# Patient Record
Sex: Female | Born: 1940 | Race: Black or African American | Hispanic: No | Smoking: Never smoker
Health system: Southern US, Community
[De-identification: ages and names within clinical notes are randomized; demographics above are authoritative.]

## PROBLEM LIST (undated history)

## (undated) DIAGNOSIS — K8020 Calculus of gallbladder without cholecystitis without obstruction: Secondary | ICD-10-CM

## (undated) DIAGNOSIS — F0390 Unspecified dementia without behavioral disturbance: Secondary | ICD-10-CM

## (undated) DIAGNOSIS — I10 Essential (primary) hypertension: Secondary | ICD-10-CM

## (undated) DIAGNOSIS — M2560 Stiffness of unspecified joint, not elsewhere classified: Secondary | ICD-10-CM

## (undated) HISTORY — PX: ABDOMINAL HYSTERECTOMY: SHX81

## (undated) HISTORY — PX: HERNIA REPAIR: SHX51

## (undated) HISTORY — DX: Essential (primary) hypertension: I10

---

## 2013-09-13 ENCOUNTER — Ambulatory Visit (INDEPENDENT_AMBULATORY_CARE_PROVIDER_SITE_OTHER): Payer: Medicare Other | Admitting: General Surgery

## 2013-09-13 ENCOUNTER — Encounter (INDEPENDENT_AMBULATORY_CARE_PROVIDER_SITE_OTHER): Payer: Self-pay | Admitting: General Surgery

## 2013-09-13 VITALS — BP 124/84 | HR 74 | Temp 97.8°F | Resp 16 | Ht 66.0 in | Wt 209.8 lb

## 2013-09-13 DIAGNOSIS — K8020 Calculus of gallbladder without cholecystitis without obstruction: Secondary | ICD-10-CM

## 2013-09-13 NOTE — Progress Notes (Signed)
Patient ID: Gabrielle PinaHelen Canny, female   DOB: 1941-05-30, 73 y.o.   MRN: [id number]  Chief Complaint  Patient presents with  . New Evaluation    gallbladder    HPI Gabrielle Brown is a 73 y.o. female.  She is referred by Dr. Sue LushAndrea, a gastro[facility]ologist [facility] for evaluation and surgical management of symptomatic gallstones. Dr. Leane Callarl Winfield is [facility] PCP and [facility].  The patient has a long history of GERD. On 07/06/2012 she had an ultrasound which shows gallstones. She was apparently had some type of GI symptoms at that time. For the past 3 months she's been having accelerating symptoms of substernal chest discomfort, postprandial vomiting, early satiety. She states that she would sometimes she will eat something and then 2 or 3 hours later she'll have problems with belching and vomiting and discomfort that radiates to her back. She denies dysphagia or painful swallowing.  Recent GI valuation led to an upper endoscopy which showed a hiatal hernia, some small polyps, since some gastritis but really no mechanical problems or obstructive problems and no neoplastic problems. After the endoscopy the gastrologist referred her for surgical intervention. She is here today with her daughter.  Comorbidities include GERD, obesity, history of abdominal hysterectomy, history of tubal ligation, and hypertension. She denies any history of stroke, cardiac disease, diabetes, COPD, tobacco or alcohol use.  She is in no distress today.  HPI  Past Medical History  Diagnosis Date  . Hypertension     Past Surgical History  Procedure Laterality Date  . Abdominal hysterectomy      No family history on file.  Social History History  Substance Use Topics  . Smoking status: Never Smoker   . Smokeless tobacco: Not on file  . Alcohol Use: No    No Known Allergies  Current Outpatient Prescriptions  Medication Sig Dispense Refill  . atenolol (TENORMIN) 25 MG tablet       . BENICAR  HCT 20-12.5 MG per tablet       . cefdinir (OMNICEF) 300 MG capsule       . omeprazole (PRILOSEC) 20 MG capsule       . PREMARIN 0.625 MG tablet       . zolpidem (AMBIEN) 10 MG tablet        No current facility-administered medications for this visit.    Review of Systems Review of Systems  Constitutional: Negative for fever, chills and unexpected weight change.  HENT: Negative for congestion, hearing loss, sore throat, trouble swallowing and voice change.   Eyes: Negative for visual disturbance.  Respiratory: Negative for cough and wheezing.   Cardiovascular: Positive for chest pain. Negative for palpitations and leg swelling.  Gastrointestinal: Positive for nausea, vomiting and abdominal pain. Negative for diarrhea, constipation, blood in stool, abdominal distention and anal bleeding.  Genitourinary: Negative for hematuria, vaginal bleeding and difficulty urinating.  Musculoskeletal: Positive for back pain. Negative for arthralgias.  Skin: Negative for rash and wound.  Neurological: Negative for seizures, syncope and headaches.  Hematological: Negative for adenopathy. Does not bruise/bleed easily.  Psychiatric/Behavioral: Negative for confusion.    Blood pressure 124/84, pulse 74, temperature 97.8 F (36.6 C), resp. rate 16, height 5\' 6"  (1.676 m), weight 209 lb 12.8 oz (95.165 kg).  Physical Exam Physical Exam  Constitutional: She is oriented to person, place, and time. She appears well-developed and well-nourished. No distress.  HENT:  Head: Normocephalic and atraumatic.  Nose: Nose normal.  Mouth/Throat: No oropharyngeal exudate.  Eyes: Conjunctivae and  EOM are normal. Pupils are equal, round, and reactive to light. Left eye exhibits no discharge. No scleral icterus.  Neck: Neck supple. No JVD present. No tracheal deviation present. No thyromegaly present.  Cardiovascular: Normal rate, regular rhythm, normal heart sounds and intact distal pulses.   No murmur  heard. Pulmonary/Chest: Effort normal and breath sounds normal. No respiratory distress. She has no wheezes. She has no rales. She exhibits no tenderness.  Abdominal: Soft. Bowel sounds are normal. She exhibits no distension and no mass. There is no tenderness. There is no rebound and no guarding.  Lower midline scar, presumably from tubal ligation. Pfannenstiel incision, presumably from hysterectomy.  Musculoskeletal: She exhibits no edema and no tenderness.  Lymphadenopathy:    She has no cervical adenopathy.  Neurological: She is alert and oriented to person, place, and time. She exhibits normal muscle tone. Coordination normal.  Skin: Skin is warm. No rash noted. She is not diaphoretic. No erythema. No pallor.  Psychiatric: She has a normal mood and affect. Her behavior is normal. Judgment and thought content normal.    Data Reviewed Upper endoscopy report. Ultrasound report. GI consultation report.  Assessment    Chronic cholecystitis with cholelithiasis. After lengthy interviewed and questions I believe that her symptoms are very consistent with biliary colic. Upper endoscopy seems to rule out any mechanical or structural problem with the esophagus or stomach.  History of GERD  History tubal ligation  History of bowel hysterectomy  Hypertension     Plan    I spent a very, very, very long time discussing this with the patient her daughter. The daughter has very good insight. The patient is a little fearful, especially if she would  have to be converted to an open laparotomy. In the end she is willing to undergo cholecystectomy and seems to understand the indications, details and techniques of the surgery.  She'll be scheduled for laparoscopic cholecystectomy with cholangiogram, possible open.  I've discussed the indications, details, techniques, and numerous risk of the surgery with the patient and her daughter. They're both aware of the risk of bleeding, infection, conversion  to open laparotomy, bile leak and readmission to  the hospital, injury to adjacent organs such as the main bile duct or intestine with major reconstructive surgery, cardiac, pulmonary, and thromboembolic problems. I've gone over all of these issues in detail with patient information booklet and  diagrams. They understand all these issues. All their questions were answered. At the end of the discussion they agree with this plan.        Angelia Mould. Derrell Lolling, M.D., [facility] General and Minimally invasive Surgery Breast and Colorectal Surgery Office:   [phone] Pager:   [phone]  09/13/2013, 12:46 PM

## 2013-09-13 NOTE — Patient Instructions (Signed)
You have gallstones, and therefore you have a diseased gallbladder.  Your episodes of discomfort and nausea and vomiting and back pain are most likely due to your gallbladder.  you will be scheduled for an elective laparoscopic cholecystectomy with cholangiogram, possible open in the near future      Laparoscopic Cholecystectomy Laparoscopic cholecystectomy is surgery to remove the gallbladder. The gallbladder is located slightly to the right of center in the abdomen, behind the liver. It is a concentrating and storage sac for the bile produced in the liver. Bile aids in the digestion and absorption of fats. Gallbladder disease (cholecystitis) is an inflammation of your gallbladder. This condition is usually caused by a buildup of gallstones (cholelithiasis) in your gallbladder. Gallstones can block the flow of bile, resulting in inflammation and pain. In severe cases, emergency surgery may be required. When emergency surgery is not required, you will have time to prepare for the procedure. Laparoscopic surgery is an alternative to open surgery. Laparoscopic surgery usually has a shorter recovery time. Your common bile duct may also need to be examined and explored. Your caregiver will discuss this with you if he or she feels this should be done. If stones are found in the common bile duct, they may be removed. LET YOUR CAREGIVER KNOW ABOUT:  Allergies to food or medicine.  Medicines taken, including vitamins, herbs, eyedrops, over-the-counter medicines, and creams.  Use of steroids (by mouth or creams).  Previous problems with anesthetics or numbing medicines.  History of bleeding problems or blood clots.  Previous surgery.  Other health problems, including diabetes and kidney problems.  Possibility of pregnancy, if this applies. RISKS AND COMPLICATIONS All surgery is associated with risks. Some problems that may occur following this procedure include:  Infection.  Damage to the  common bile duct, nerves, arteries, veins, or other internal organs such as the stomach or intestines.  Bleeding.  A stone may remain in the common bile duct. BEFORE THE PROCEDURE  Do not take aspirin for 3 days prior to surgery or blood thinners for 1 week prior to surgery.  Do not eat or drink anything after midnight the night before surgery.  Let your caregiver know if you develop a cold or other infectious problem prior to surgery.  You should be present 60 minutes before the procedure or as directed. PROCEDURE  You will be given medicine that makes you sleep (general anesthetic). When you are asleep, your surgeon will make several small cuts (incisions) in your abdomen. One of these incisions is used to insert a small, lighted scope (laparoscope) into the abdomen. The laparoscope helps the surgeon see into your abdomen. Carbon dioxide gas will be pumped into your abdomen. The gas allows more room for the surgeon to perform your surgery. Other operating instruments are inserted through the other incisions. Laparoscopic procedures may not be appropriate when:  There is major scarring from previous surgery.  The gallbladder is extremely inflamed.  There are bleeding disorders or unexpected cirrhosis of the liver.  A pregnancy is near term.  Other conditions make the laparoscopic procedure impossible. If your surgeon feels it is not safe to continue with a laparoscopic procedure, he or she will perform an open abdominal procedure. In this case, the surgeon will make an incision to open the abdomen. This gives the surgeon a larger view and field to work within. This may allow the surgeon to perform procedures that sometimes cannot be performed with a laparoscope alone. Open surgery has a longer recovery  time. AFTER THE PROCEDURE  You will be taken to the recovery area where a nurse will watch and check your progress.  You may be allowed to go home the same day.  Do not resume  physical activities until directed by your caregiver.  You may resume a normal diet and activities as directed. Document Released: 08/25/2005 Document Revised: 11/17/2011 Document Reviewed: 04/06/2013 [facility] Patient Information 2014 [place], [place].

## 2013-09-14 ENCOUNTER — Encounter (INDEPENDENT_AMBULATORY_CARE_PROVIDER_SITE_OTHER): Payer: Self-pay

## 2013-09-16 ENCOUNTER — Encounter (HOSPITAL_COMMUNITY): Payer: Self-pay | Admitting: Pharmacy Technician

## 2013-09-20 ENCOUNTER — Ambulatory Visit (HOSPITAL_COMMUNITY)
Admission: RE | Admit: 2013-09-20 | Discharge: 2013-09-20 | Disposition: A | Discharge status: Home / Self Care | Payer: Medicare Other | Source: Ambulatory Visit | Attending: General Surgery | Admitting: General Surgery

## 2013-09-20 ENCOUNTER — Encounter (HOSPITAL_COMMUNITY): Payer: Self-pay

## 2013-09-20 ENCOUNTER — Encounter (HOSPITAL_COMMUNITY)
Admission: RE | Admit: 2013-09-20 | Discharge: 2013-09-20 | Disposition: A | Discharge status: Home / Self Care | Payer: Medicare Other | Source: Ambulatory Visit | Attending: General Surgery | Admitting: General Surgery

## 2013-09-20 DIAGNOSIS — Z01812 Encounter for preprocedural laboratory examination: Secondary | ICD-10-CM | POA: Insufficient documentation

## 2013-09-20 DIAGNOSIS — K8020 Calculus of gallbladder without cholecystitis without obstruction: Secondary | ICD-10-CM | POA: Insufficient documentation

## 2013-09-20 DIAGNOSIS — Z01818 Encounter for other preprocedural examination: Secondary | ICD-10-CM | POA: Insufficient documentation

## 2013-09-20 DIAGNOSIS — I771 Stricture of artery: Secondary | ICD-10-CM | POA: Insufficient documentation

## 2013-09-20 DIAGNOSIS — Z01810 Encounter for preprocedural cardiovascular examination: Secondary | ICD-10-CM | POA: Insufficient documentation

## 2013-09-20 DIAGNOSIS — I10 Essential (primary) hypertension: Secondary | ICD-10-CM | POA: Insufficient documentation

## 2013-09-20 HISTORY — DX: Calculus of gallbladder without cholecystitis without obstruction: K80.20

## 2013-09-20 HISTORY — DX: Stiffness of unspecified joint, not elsewhere classified: M25.60

## 2013-09-20 LAB — COMPREHENSIVE METABOLIC PANEL
ALBUMIN: 3.4 g/dL — AB (ref 3.5–5.2)
ALT: 16 U/L (ref 0–35)
AST: 18 U/L (ref 0–37)
Alkaline Phosphatase: 80 U/L (ref 39–117)
BUN: 14 mg/dL (ref 6–23)
CALCIUM: 9.2 mg/dL (ref 8.4–10.5)
CO2: 28 mEq/L (ref 19–32)
Chloride: 95 mEq/L — ABNORMAL LOW (ref 96–112)
Creatinine, Ser: 0.84 mg/dL (ref 0.50–1.10)
GFR calc non Af Amer: 67 mL/min — ABNORMAL LOW (ref >90)
GFR, EST AFRICAN AMERICAN: 78 mL/min — AB (ref >90)
GLUCOSE: 95 mg/dL (ref 70–99)
Potassium: 4.3 mEq/L (ref 3.7–5.3)
Sodium: 134 mEq/L — ABNORMAL LOW (ref 137–147)
Total Bilirubin: 0.3 mg/dL (ref 0.3–1.2)
Total Protein: 7 g/dL (ref 6.0–8.3)

## 2013-09-20 LAB — CBC WITH DIFFERENTIAL/PLATELET
BASOS PCT: 0 % (ref 0–1)
Basophils Absolute: 0 10*3/uL (ref 0.0–0.1)
EOS ABS: 0.1 10*3/uL (ref 0.0–0.7)
Eosinophils Relative: 1 % (ref 0–5)
HCT: 38.7 % (ref 36.0–46.0)
Hemoglobin: 12.8 g/dL (ref 12.0–15.0)
LYMPHS PCT: 24 % (ref 12–46)
Lymphs Abs: 2.1 10*3/uL (ref 0.7–4.0)
MCH: 23.3 pg — AB (ref 26.0–34.0)
MCHC: 33.1 g/dL (ref 30.0–36.0)
MCV: 70.4 fL — ABNORMAL LOW (ref 78.0–100.0)
MONO ABS: 0.9 10*3/uL (ref 0.1–1.0)
Monocytes Relative: 10 % (ref 3–12)
Neutro Abs: 5.7 10*3/uL (ref 1.7–7.7)
Neutrophils Relative %: 65 % (ref 43–77)
PLATELETS: 162 10*3/uL (ref 150–400)
RBC: 5.5 MIL/uL — ABNORMAL HIGH (ref 3.87–5.11)
RDW: 13.7 % (ref 11.5–15.5)
WBC: 8.8 10*3/uL (ref 4.0–10.5)

## 2013-09-20 NOTE — Patient Instructions (Signed)
YOUR SURGERY IS SCHEDULED AT [facility]  ON:  Tuesday  1/20  REPORT TO  SHORT STAY CENTER AT:   7:10 AM      PHONE # FOR SHORT STAY IS [phone]  DO NOT EAT OR DRINK ANYTHING AFTER MIDNIGHT THE NIGHT BEFORE YOUR SURGERY.  YOU MAY BRUSH YOUR TEETH, RINSE OUT YOUR MOUTH--BUT NO WATER, NO FOOD, NO CHEWING GUM, NO MINTS, NO CANDIES, NO CHEWING TOBACCO.  PLEASE TAKE THE FOLLOWING MEDICATIONS THE AM OF YOUR SURGERY WITH A FEW SIPS OF WATER:  PREMARIN, ATENOLOL, OMEPRAZOLE   DO NOT BRING VALUABLES, MONEY, CREDIT CARDS.  DO NOT WEAR JEWELRY, MAKE-UP, NAIL POLISH AND NO METAL PINS OR CLIPS IN YOUR HAIR. CONTACT LENS, DENTURES / PARTIALS, GLASSES SHOULD NOT BE WORN TO SURGERY AND IN MOST CASES-HEARING AIDS WILL NEED TO BE REMOVED.  BRING YOUR GLASSES CASE, ANY EQUIPMENT NEEDED FOR YOUR CONTACT LENS. FOR PATIENTS ADMITTED TO THE HOSPITAL--CHECK OUT TIME THE DAY OF DISCHARGE IS 11:00 AM.  ALL INPATIENT ROOMS ARE PRIVATE - WITH BATHROOM, TELEPHONE, TELEVISION AND WIFI INTERNET.                                                    PLEASE READ OVER ANY  FACT SHEETS THAT YOU WERE GIVEN: MRSA INFORMATION, BLOOD TRANSFUSION INFORMATION, INCENTIVE SPIROMETER INFORMATION.  FAILURE TO FOLLOW THESE INSTRUCTIONS MAY RESULT IN THE CANCELLATION OF YOUR SURGERY. PLEASE BE AWARE THAT YOU MAY NEED ADDITIONAL BLOOD DRAWN DAY OF YOUR SURGERY  PATIENT SIGNATURE_________________________________

## 2013-09-20 NOTE — Pre-Procedure Instructions (Addendum)
E[facility] AND [facility] WERE DONE TODAY [facility]. PT DOES NOT READ WELL - HER DISCHARGE INSTRUCTIONS WERE REVIEWED WITH HER AND HER DAUGHTER TERESA PLUMMER - TERESA VOICED UNDERSTANDING AND SIGNED INSTRUCTIONS.  PT LIVES WITH HER DAUGHTER AND HER DAUGHTER WILL HELP HER MOTHER WITH FOLLOWINGTHE PREOP INSTRUCTIONS.

## 2013-09-26 NOTE — H&P (Signed)
Gabrielle Brown   MRN:  [id number]   Description: 73 year old female  Provider: Ernestene Mention, MD  Department: [facility]          Diagnoses      Gallstones    -  Primary      574.20               Current Vitals - Last Recorded      BP Pulse Temp(Src) Resp Ht Wt      124/84 74 97.8 F (36.6 C) 16 5\' 6"  (1.676 m) 209 lb 12.8 oz (95.165 kg)     BMI  33.88 kg/m2                         History and Physical      Ernestene Mention, MD      Status: Signed            Patient ID: Gabrielle Brown, female   DOB: 1941-04-08, 73 y.o.   MRN: [id number]             HPI [name] is a 73 y.o. female.  She is referred by Dr. Sue Lush, a gastroenterologist at [facility] for evaluation and surgical management of symptomatic gallstones. Dr. Leane Call is her PCP and [facility].   The patient has a long history of GERD. On 07/06/2012 she had an ultrasound which shows gallstones. She was apparently had some type of GI symptoms at that time. For the past 3 months she's been having accelerating symptoms of substernal chest discomfort, postprandial vomiting, early satiety. She states that she would sometimes she will eat something and then 2 or 3 hours later she'll have problems with belching and vomiting and discomfort that radiates to her back. She denies dysphagia or painful swallowing.   Recent GI valuation led to an upper endoscopy which showed a hiatal hernia, some small polyps, since some gastritis but really no mechanical problems or obstructive problems and no neoplastic problems. After the endoscopy the gastrologist referred her for surgical intervention. She is here today with her daughter.   Comorbidities include GERD, obesity, history of abdominal hysterectomy, history of tubal ligation, and hypertension. She denies any history of stroke, cardiac disease, diabetes, COPD, tobacco or alcohol use.   She is in no distress today.         Past Medical History   Diagnosis  Date   .  Hypertension           Past Surgical History   Procedure  Laterality  Date   .  Abdominal hysterectomy            No family history on file.   Social History History   Substance Use Topics   .  Smoking status:  Never Smoker    .  Smokeless tobacco:  Not on file   .  Alcohol Use:  No        No Known Allergies    Current Outpatient Prescriptions   Medication  Sig  Dispense  Refill   .  atenolol (TENORMIN) 25 MG tablet           .  BENICAR HCT 20-12.5 MG per tablet           .  cefdinir (OMNICEF) 300 MG capsule           .  omeprazole (PRILOSEC) 20 MG capsule           .  PREMARIN 0.625 MG tablet           .  zolpidem (AMBIEN) 10 MG tablet               No current facility-administered medications for this visit.        Review of Systems  Constitutional: Negative for fever, chills and unexpected weight change.  HENT: Negative for congestion, hearing loss, sore throat, trouble swallowing and voice change.   Eyes: Negative for visual disturbance.  Respiratory: Negative for cough and wheezing.   Cardiovascular: Positive for chest pain. Negative for palpitations and leg swelling.  Gastrointestinal: Positive for nausea, vomiting and abdominal pain. Negative for diarrhea, constipation, blood in stool, abdominal distention and anal bleeding.  Genitourinary: Negative for hematuria, vaginal bleeding and difficulty urinating.  Musculoskeletal: Positive for back pain. Negative for arthralgias.  Skin: Negative for rash and wound.  Neurological: Negative for seizures, syncope and headaches.  Hematological: Negative for adenopathy. Does not bruise/bleed easily.  Psychiatric/Behavioral: Negative for confusion.      Blood pressure 124/84, pulse 74, temperature 97.8 F (36.6 C), resp. rate 16, height 5\' 6"  (1.676 m), weight 209 lb 12.8 oz (95.165 kg).   Physical Exam  Constitutional: She is oriented to person, place, and  time. She appears well-developed and well-nourished. No distress.  HENT:   Head: Normocephalic and atraumatic.   Nose: Nose normal.   Mouth/Throat: No oropharyngeal exudate.  Eyes: Conjunctivae and EOM are normal. Pupils are equal, round, and reactive to light. Left eye exhibits no discharge. No scleral icterus.  Neck: Neck supple. No JVD present. No tracheal deviation present. No thyromegaly present.  Cardiovascular: Normal rate, regular rhythm, normal heart sounds and intact distal pulses.    No murmur heard. Pulmonary/Chest: Effort normal and breath sounds normal. No respiratory distress. She has no wheezes. She has no rales. She exhibits no tenderness.  Abdominal: Soft. Bowel sounds are normal. She exhibits no distension and no mass. There is no tenderness. There is no rebound and no guarding.  Lower midline scar, presumably from tubal ligation. Pfannenstiel incision, presumably from hysterectomy.  Musculoskeletal: She exhibits no edema and no tenderness.  Lymphadenopathy:    She has no cervical adenopathy.  Neurological: She is alert and oriented to person, place, and time. She exhibits normal muscle tone. Coordination normal.  Skin: Skin is warm. No rash noted. She is not diaphoretic. No erythema. No pallor.  Psychiatric: She has a normal mood and affect. Her behavior is normal. Judgment and thought content normal.      Data Reviewed Upper endoscopy report. Ultrasound report. GI consultation report.   Assessment    Chronic cholecystitis with cholelithiasis. After lengthy interviewed and questions I believe that her symptoms are very consistent with biliary colic. Upper endoscopy seems to rule out any mechanical or structural problem with the esophagus or stomach.   History of GERD   History tubal ligation   History of bowel hysterectomy   Hypertension      Plan    I spent a very, very, very long time discussing this with the patient her daughter. The daughter has  very good insight. The patient is a little fearful, especially if she would  have to be converted to an open laparotomy. In the end she is willing to undergo cholecystectomy and seems to understand the indications, details and techniques of the surgery.   She'll be scheduled for laparoscopic cholecystectomy with cholangiogram, possible open.   I've discussed the indications, details, techniques, and numerous  risk of the surgery with the patient and her daughter. They're both aware of the risk of bleeding, infection, conversion to open laparotomy, bile leak and readmission to  the hospital, injury to adjacent organs such as the main bile duct or intestine with major reconstructive surgery, cardiac, pulmonary, and thromboembolic problems. I've gone over all of these issues in detail with patient information booklet and  diagrams. They understand all these issues. All their questions were answered. At the end of the discussion they agree with this plan.         Angelia Mould. Derrell Lolling, M.D., [facility] General and Minimally invasive Surgery Breast and Colorectal Surgery Office:   [phone] Pager:   [phone]

## 2013-09-27 ENCOUNTER — Ambulatory Visit (HOSPITAL_COMMUNITY): Payer: Medicare Other

## 2013-09-27 ENCOUNTER — Ambulatory Visit (HOSPITAL_COMMUNITY)
Admission: RE | Admit: 2013-09-27 | Discharge: 2013-09-28 | Disposition: A | Discharge status: Home / Self Care | Payer: Medicare Other | Source: Ambulatory Visit | Attending: General Surgery | Admitting: General Surgery

## 2013-09-27 ENCOUNTER — Encounter (HOSPITAL_COMMUNITY): Payer: Medicare Other | Admitting: Anesthesiology

## 2013-09-27 ENCOUNTER — Encounter (HOSPITAL_COMMUNITY): Payer: Self-pay | Admitting: *Deleted

## 2013-09-27 ENCOUNTER — Ambulatory Visit (HOSPITAL_COMMUNITY): Payer: Medicare Other | Admitting: Anesthesiology

## 2013-09-27 ENCOUNTER — Encounter (HOSPITAL_COMMUNITY)
Admission: RE | Disposition: A | Discharge status: Home / Self Care | Payer: Self-pay | Source: Ambulatory Visit | Attending: General Surgery

## 2013-09-27 DIAGNOSIS — K824 Cholesterolosis of gallbladder: Secondary | ICD-10-CM

## 2013-09-27 DIAGNOSIS — K420 Umbilical hernia with obstruction, without gangrene: Secondary | ICD-10-CM | POA: Insufficient documentation

## 2013-09-27 DIAGNOSIS — Z79899 Other long term (current) drug therapy: Secondary | ICD-10-CM | POA: Insufficient documentation

## 2013-09-27 DIAGNOSIS — I10 Essential (primary) hypertension: Secondary | ICD-10-CM | POA: Insufficient documentation

## 2013-09-27 DIAGNOSIS — K8020 Calculus of gallbladder without cholecystitis without obstruction: Secondary | ICD-10-CM

## 2013-09-27 DIAGNOSIS — K8010 Calculus of gallbladder with chronic cholecystitis without obstruction: Secondary | ICD-10-CM

## 2013-09-27 DIAGNOSIS — I440 Atrioventricular block, first degree: Secondary | ICD-10-CM | POA: Insufficient documentation

## 2013-09-27 DIAGNOSIS — E669 Obesity, unspecified: Secondary | ICD-10-CM | POA: Insufficient documentation

## 2013-09-27 DIAGNOSIS — Z90710 Acquired absence of both cervix and uterus: Secondary | ICD-10-CM | POA: Insufficient documentation

## 2013-09-27 DIAGNOSIS — K219 Gastro-esophageal reflux disease without esophagitis: Secondary | ICD-10-CM | POA: Insufficient documentation

## 2013-09-27 HISTORY — PX: CHOLECYSTECTOMY: SHX55

## 2013-09-27 LAB — BASIC METABOLIC PANEL
BUN: 10 mg/dL (ref 6–23)
CO2: 28 mEq/L (ref 19–32)
Calcium: 9 mg/dL (ref 8.4–10.5)
Chloride: 97 mEq/L (ref 96–112)
Creatinine, Ser: 0.8 mg/dL (ref 0.50–1.10)
GFR calc Af Amer: 83 mL/min — ABNORMAL LOW (ref >90)
GFR, EST NON AFRICAN AMERICAN: 71 mL/min — AB (ref >90)
GLUCOSE: 108 mg/dL — AB (ref 70–99)
Potassium: 3.7 mEq/L (ref 3.7–5.3)
Sodium: 136 mEq/L — ABNORMAL LOW (ref 137–147)

## 2013-09-27 LAB — MAGNESIUM: MAGNESIUM: 1.8 mg/dL (ref 1.5–2.5)

## 2013-09-27 SURGERY — LAPAROSCOPIC CHOLECYSTECTOMY
Anesthesia: General | Site: Abdomen

## 2013-09-27 MED ORDER — PROMETHAZINE HCL 25 MG/ML IJ SOLN: 6.2500 mg | INTRAMUSCULAR | Status: DC | PRN

## 2013-09-27 MED ORDER — 0.9 % SODIUM CHLORIDE (POUR BTL) OPTIME
TOPICAL | Status: DC | PRN
  Administered 2013-09-27: 1000 mL

## 2013-09-27 MED ORDER — HYDROMORPHONE HCL PF 1 MG/ML IJ SOLN
INTRAMUSCULAR | Status: AC
  Administered 2013-09-27: 14:00:00
  Filled 2013-09-27: qty 1

## 2013-09-27 MED ORDER — ENOXAPARIN SODIUM 40 MG/0.4ML ~~LOC~~ SOLN
40.0000 mg | SUBCUTANEOUS | Status: DC
  Administered 2013-09-28: 40 mg via SUBCUTANEOUS
  Filled 2013-09-27 (×2): qty 0.4

## 2013-09-27 MED ORDER — PROPOFOL 10 MG/ML IV BOLUS
INTRAVENOUS | Status: DC | PRN
  Administered 2013-09-27: 40 mg via INTRAVENOUS
  Administered 2013-09-27: 80 mg via INTRAVENOUS

## 2013-09-27 MED ORDER — FENTANYL CITRATE 0.05 MG/ML IJ SOLN
INTRAMUSCULAR | Status: DC | PRN
  Administered 2013-09-27: 25 ug via INTRAVENOUS
  Administered 2013-09-27: 50 ug via INTRAVENOUS
  Administered 2013-09-27: 25 ug via INTRAVENOUS

## 2013-09-27 MED ORDER — ONDANSETRON HCL 4 MG/2ML IJ SOLN
INTRAMUSCULAR | Status: DC | PRN
  Administered 2013-09-27 (×2): 2 mg via INTRAVENOUS

## 2013-09-27 MED ORDER — NEOSTIGMINE METHYLSULFATE 1 MG/ML IJ SOLN
INTRAMUSCULAR | Status: AC
  Filled 2013-09-27: qty 10

## 2013-09-27 MED ORDER — EPHEDRINE SULFATE 50 MG/ML IJ SOLN
INTRAMUSCULAR | Status: DC | PRN
  Administered 2013-09-27: 7.5 mg via INTRAVENOUS
  Administered 2013-09-27: 5 mg via INTRAVENOUS

## 2013-09-27 MED ORDER — MIDAZOLAM HCL 5 MG/5ML IJ SOLN
INTRAMUSCULAR | Status: DC | PRN
  Administered 2013-09-27: 0.5 mg via INTRAVENOUS

## 2013-09-27 MED ORDER — ZOLPIDEM TARTRATE 5 MG PO TABS
5.0000 mg | ORAL_TABLET | Freq: Every evening | ORAL | Status: DC | PRN
  Administered 2013-09-28: 5 mg via ORAL
  Filled 2013-09-27: qty 1

## 2013-09-27 MED ORDER — CISATRACURIUM BESYLATE (PF) 10 MG/5ML IV SOLN
INTRAVENOUS | Status: DC | PRN
  Administered 2013-09-27: 5 mg via INTRAVENOUS

## 2013-09-27 MED ORDER — PANTOPRAZOLE SODIUM 40 MG PO TBEC
40.0000 mg | DELAYED_RELEASE_TABLET | Freq: Every day | ORAL | Status: DC
  Administered 2013-09-28: 40 mg via ORAL
  Filled 2013-09-27: qty 1

## 2013-09-27 MED ORDER — ZOLPIDEM TARTRATE 10 MG PO TABS: 10.0000 mg | ORAL_TABLET | Freq: Every evening | ORAL | Status: DC | PRN

## 2013-09-27 MED ORDER — FENTANYL CITRATE 0.05 MG/ML IJ SOLN: 25.0000 ug | INTRAMUSCULAR | Status: DC | PRN

## 2013-09-27 MED ORDER — ATENOLOL 25 MG PO TABS
25.0000 mg | ORAL_TABLET | Freq: Two times a day (BID) | ORAL | Status: DC
Start: 2013-09-27 — End: 2013-09-28
  Administered 2013-09-27 – 2013-09-28 (×2): 25 mg via ORAL
  Filled 2013-09-27 (×4): qty 1

## 2013-09-27 MED ORDER — BUPIVACAINE-EPINEPHRINE PF 0.5-1:200000 % IJ SOLN
INTRAMUSCULAR | Status: AC
  Filled 2013-09-27: qty 30

## 2013-09-27 MED ORDER — CHLORHEXIDINE GLUCONATE 4 % EX LIQD: 1.0000 "application " | Freq: Once | CUTANEOUS | Status: DC

## 2013-09-27 MED ORDER — MIDAZOLAM HCL 2 MG/2ML IJ SOLN
INTRAMUSCULAR | Status: AC
  Filled 2013-09-27: qty 2

## 2013-09-27 MED ORDER — CEFAZOLIN SODIUM-DEXTROSE 2-3 GM-% IV SOLR
2.0000 g | INTRAVENOUS | Status: AC
  Administered 2013-09-27: 2 g via INTRAVENOUS

## 2013-09-27 MED ORDER — NEOSTIGMINE METHYLSULFATE 1 MG/ML IJ SOLN
INTRAMUSCULAR | Status: DC | PRN
  Administered 2013-09-27: 3 mg via INTRAVENOUS

## 2013-09-27 MED ORDER — HYDROMORPHONE HCL PF 1 MG/ML IJ SOLN
0.2500 mg | INTRAMUSCULAR | Status: DC | PRN
  Administered 2013-09-27: 0.5 mg via INTRAVENOUS

## 2013-09-27 MED ORDER — SODIUM CHLORIDE 0.9 % IJ SOLN
INTRAMUSCULAR | Status: AC
  Filled 2013-09-27: qty 10

## 2013-09-27 MED ORDER — IOHEXOL 300 MG/ML  SOLN
INTRAMUSCULAR | Status: DC | PRN
  Administered 2013-09-27: 17 mL

## 2013-09-27 MED ORDER — ONDANSETRON HCL 4 MG/2ML IJ SOLN
4.0000 mg | Freq: Four times a day (QID) | INTRAMUSCULAR | Status: DC | PRN
  Filled 2013-09-27: qty 2

## 2013-09-27 MED ORDER — ESTROGENS CONJUGATED 0.625 MG PO TABS
0.6250 mg | ORAL_TABLET | Freq: Every morning | ORAL | Status: DC
  Administered 2013-09-28: 0.625 mg via ORAL
  Filled 2013-09-27: qty 1

## 2013-09-27 MED ORDER — CEFAZOLIN SODIUM-DEXTROSE 2-3 GM-% IV SOLR
INTRAVENOUS | Status: AC
  Filled 2013-09-27: qty 50

## 2013-09-27 MED ORDER — KETAMINE HCL 10 MG/ML IJ SOLN
INTRAMUSCULAR | Status: AC
  Filled 2013-09-27: qty 1

## 2013-09-27 MED ORDER — ONDANSETRON HCL 4 MG PO TABS
4.0000 mg | ORAL_TABLET | Freq: Four times a day (QID) | ORAL | Status: DC | PRN
  Administered 2013-09-27: 4 mg via ORAL

## 2013-09-27 MED ORDER — FENTANYL CITRATE 0.05 MG/ML IJ SOLN
INTRAMUSCULAR | Status: AC
  Filled 2013-09-27: qty 5

## 2013-09-27 MED ORDER — LIDOCAINE HCL (CARDIAC) 20 MG/ML IV SOLN
INTRAVENOUS | Status: DC | PRN
  Administered 2013-09-27: 30 mg via INTRAVENOUS

## 2013-09-27 MED ORDER — OXYCODONE HCL 5 MG PO TABS: 5.0000 mg | ORAL_TABLET | Freq: Once | ORAL | Status: DC | PRN

## 2013-09-27 MED ORDER — LACTATED RINGERS IR SOLN
Status: DC | PRN
  Administered 2013-09-27: 1000 mL

## 2013-09-27 MED ORDER — MEPERIDINE HCL 50 MG/ML IJ SOLN: 6.2500 mg | INTRAMUSCULAR | Status: DC | PRN

## 2013-09-27 MED ORDER — ONDANSETRON HCL 4 MG/2ML IJ SOLN
INTRAMUSCULAR | Status: AC
  Filled 2013-09-27: qty 2

## 2013-09-27 MED ORDER — POTASSIUM CHLORIDE IN NACL 20-0.9 MEQ/L-% IV SOLN
INTRAVENOUS | Status: DC
  Administered 2013-09-27: 18:00:00 via INTRAVENOUS
  Filled 2013-09-27 (×4): qty 1000

## 2013-09-27 MED ORDER — SUCCINYLCHOLINE CHLORIDE 20 MG/ML IJ SOLN
INTRAMUSCULAR | Status: DC | PRN
  Administered 2013-09-27: 100 mg via INTRAVENOUS

## 2013-09-27 MED ORDER — HYDROCHLOROTHIAZIDE 12.5 MG PO CAPS
12.5000 mg | ORAL_CAPSULE | Freq: Every day | ORAL | Status: DC
  Administered 2013-09-27 – 2013-09-28 (×2): 12.5 mg via ORAL
  Filled 2013-09-27 (×2): qty 1

## 2013-09-27 MED ORDER — BUPIVACAINE-EPINEPHRINE 0.5% -1:200000 IJ SOLN
INTRAMUSCULAR | Status: DC | PRN
  Administered 2013-09-27: 18 mL

## 2013-09-27 MED ORDER — OXYCODONE HCL 5 MG/5ML PO SOLN
5.0000 mg | Freq: Once | ORAL | Status: DC | PRN
  Filled 2013-09-27: qty 5

## 2013-09-27 MED ORDER — PROPOFOL 10 MG/ML IV BOLUS
INTRAVENOUS | Status: AC
  Filled 2013-09-27: qty 20

## 2013-09-27 MED ORDER — OLMESARTAN MEDOXOMIL-HCTZ 20-12.5 MG PO TABS: 1.0000 | ORAL_TABLET | Freq: Every morning | ORAL | Status: DC

## 2013-09-27 MED ORDER — LIDOCAINE HCL (CARDIAC) 20 MG/ML IV SOLN
INTRAVENOUS | Status: AC
  Filled 2013-09-27: qty 5

## 2013-09-27 MED ORDER — OXYCODONE-ACETAMINOPHEN 5-325 MG PO TABS
1.0000 | ORAL_TABLET | ORAL | Status: DC | PRN
  Administered 2013-09-27 (×2): 1 via ORAL
  Filled 2013-09-27 (×2): qty 1

## 2013-09-27 MED ORDER — KETAMINE HCL 10 MG/ML IJ SOLN
INTRAMUSCULAR | Status: DC | PRN
  Administered 2013-09-27: 10 mg via INTRAVENOUS

## 2013-09-27 MED ORDER — GLYCOPYRROLATE 0.2 MG/ML IJ SOLN
INTRAMUSCULAR | Status: DC | PRN
  Administered 2013-09-27: 0.4 mg via INTRAVENOUS
  Administered 2013-09-27: 0.2 mg via INTRAVENOUS

## 2013-09-27 MED ORDER — IRBESARTAN 150 MG PO TABS
150.0000 mg | ORAL_TABLET | Freq: Every day | ORAL | Status: DC
  Administered 2013-09-27 – 2013-09-28 (×2): 150 mg via ORAL
  Filled 2013-09-27 (×2): qty 1

## 2013-09-27 MED ORDER — LACTATED RINGERS IV SOLN
INTRAVENOUS | Status: DC | PRN
  Administered 2013-09-27: 09:00:00 via INTRAVENOUS

## 2013-09-27 SURGICAL SUPPLY — 35 items
APPLIER CLIP ROT 10 11.4 M/L (STAPLE) ×3
BENZOIN TINCTURE PRP APPL 2/3 (GAUZE/BANDAGES/DRESSINGS) IMPLANT
CANISTER SUCTION 2500CC (MISCELLANEOUS) ×3 IMPLANT
CLIP APPLIE ROT 10 11.4 M/L (STAPLE) ×1 IMPLANT
CLOSURE WOUND 1/2 X4 (GAUZE/BANDAGES/DRESSINGS)
COVER MAYO STAND STRL (DRAPES) ×3 IMPLANT
DECANTER SPIKE VIAL GLASS SM (MISCELLANEOUS) IMPLANT
DERMABOND ADVANCED (GAUZE/BANDAGES/DRESSINGS) ×2
DERMABOND ADVANCED .7 DNX12 (GAUZE/BANDAGES/DRESSINGS) ×1 IMPLANT
DRAPE C-ARM 42X120 X-RAY (DRAPES) ×3 IMPLANT
DRAPE LAPAROSCOPIC ABDOMINAL (DRAPES) ×3 IMPLANT
ELECT REM PT RETURN 9FT ADLT (ELECTROSURGICAL) ×3
ELECTRODE REM PT RTRN 9FT ADLT (ELECTROSURGICAL) ×1 IMPLANT
GLOVE BIOGEL PI IND STRL 7.0 (GLOVE) ×1 IMPLANT
GLOVE BIOGEL PI INDICATOR 7.0 (GLOVE) ×2
GLOVE EUDERMIC 7 POWDERFREE (GLOVE) ×3 IMPLANT
GOWN STRL REUS W/TWL LRG LVL3 (GOWN DISPOSABLE) ×3 IMPLANT
GOWN STRL REUS W/TWL XL LVL3 (GOWN DISPOSABLE) ×6 IMPLANT
HEMOSTAT SURGICEL 4X8 (HEMOSTASIS) IMPLANT
KIT BASIN OR (CUSTOM PROCEDURE TRAY) ×3 IMPLANT
NS IRRIG 1000ML POUR BTL (IV SOLUTION) ×3 IMPLANT
PENCIL BUTTON HOLSTER BLD 10FT (ELECTRODE) ×3 IMPLANT
POUCH SPECIMEN RETRIEVAL 10MM (ENDOMECHANICALS) IMPLANT
SET CHOLANGIOGRAPH MIX (MISCELLANEOUS) ×3 IMPLANT
SET IRRIG TUBING LAPAROSCOPIC (IRRIGATION / IRRIGATOR) ×3 IMPLANT
SOLUTION ANTI FOG 6CC (MISCELLANEOUS) ×3 IMPLANT
STRIP CLOSURE SKIN 1/2X4 (GAUZE/BANDAGES/DRESSINGS) IMPLANT
SUT MNCRL AB 4-0 PS2 18 (SUTURE) ×3 IMPLANT
SUT NOVA NAB DX-16 0-1 5-0 T12 (SUTURE) ×3 IMPLANT
TOWEL OR 17X26 10 PK STRL BLUE (TOWEL DISPOSABLE) ×9 IMPLANT
TRAY LAP CHOLE (CUSTOM PROCEDURE TRAY) ×3 IMPLANT
TROCAR BLADELESS OPT 5 75 (ENDOMECHANICALS) IMPLANT
TROCAR XCEL BLUNT TIP 100MML (ENDOMECHANICALS) ×3 IMPLANT
TROCAR XCEL NON-BLD 11X100MML (ENDOMECHANICALS) IMPLANT
TUBING INSUFFLATION 10FT LAP (TUBING) ×3 IMPLANT

## 2013-09-27 NOTE — Anesthesia Preprocedure Evaluation (Signed)
Anesthesia Evaluation  Patient identified by MRN, date of birth, ID band Patient awake    Reviewed: Allergy & Precautions, H&P , NPO status , Patient's Chart, lab work & pertinent test results  Airway Mallampati: II TM Distance: >3 FB Neck ROM: Full    Dental  (+) Dental Advisory Given   Pulmonary neg pulmonary ROS,          Cardiovascular hypertension, Pt. on medications Rhythm:Regular Rate:Normal     Neuro/Psych negative neurological ROS  negative psych ROS   GI/Hepatic negative GI ROS, Neg liver ROS,   Endo/Other  negative endocrine ROS  Renal/GU negative Renal ROS     Musculoskeletal negative musculoskeletal ROS (+)   Abdominal   Peds  Hematology negative hematology ROS (+)   Anesthesia Other Findings   Reproductive/Obstetrics negative OB ROS                           Anesthesia Physical Anesthesia Plan  ASA: II  Anesthesia Plan: General   Post-op Pain Management:    Induction: Intravenous  Airway Management Planned: Oral ETT  Additional Equipment:   Intra-op Plan:   Post-operative Plan: Extubation in OR  Informed Consent: I have reviewed the patients History and Physical, chart, labs and discussed the procedure including the risks, benefits and alternatives for the proposed anesthesia with the patient or authorized representative who has indicated his/her understanding and acceptance.   Dental advisory given  Plan Discussed with: CRNA  Anesthesia Plan Comments:         Anesthesia Quick Evaluation

## 2013-09-27 NOTE — Transfer of Care (Signed)
Immediate Anesthesia Transfer of Care Note  Patient: Gabrielle PinaHelen Brown  Procedure(s) Performed: Procedure(s): LAPAROSCOPIC CHOLECYSTECTOMY, CHOLANGIOGRAM, REPAIR OF UMBILICAL HERNIA (N/A)  Patient Location: PACU  Anesthesia Type:General  Level of Consciousness: awake, alert , oriented and patient cooperative  Airway & Oxygen Therapy: Patient Spontanous Breathing and Patient connected to face mask oxygen  Post-op Assessment: Report given to PACU RN and Post -op Vital signs reviewed and stable  Post vital signs: stable  Complications: No apparent anesthesia complications

## 2013-09-27 NOTE — Anesthesia Postprocedure Evaluation (Signed)
Anesthesia Post Note  Patient: Gabrielle PinaHelen Brown  Procedure(s) Performed: Procedure(s) (LRB): LAPAROSCOPIC CHOLECYSTECTOMY, CHOLANGIOGRAM, REPAIR OF UMBILICAL HERNIA (N/A)  Anesthesia type: General  Patient location: PACU  Post pain: Pain level controlled  Post assessment: Post-op Vital signs reviewed  Last Vitals: BP 157/83  Pulse 76  Temp(Src) 36.4 C (Oral)  Resp 20  SpO2 98%  Post vital signs: Reviewed  Level of consciousness: sedated  Complications: No apparent anesthesia complications

## 2013-09-27 NOTE — Op Note (Addendum)
Patient Name:           Gabrielle Brown   Date of Surgery:        09/27/2013  Pre op Diagnosis:      Chronic cholecystitis with cholelithiasis  Post op Diagnosis:    Chronic cholecystitis with cholelithiasis, incarcerated umbilical hernia  Procedure:                 Laparoscopic cholecystectomy with cholangiogram, repair of incarceratedumbilical hernia  Surgeon:                     Angelia Mould. Derrell Lolling, M.D., FACS  Assistant:                      Glenna Fellows, M.D., FACS  Operative Indications:   Gabrielle Brown is a 73 y.o. female. She is referred by Dr. Sue Lush, a gastroenterologist at [facility] for evaluation and surgical management of symptomatic gallstones. Dr. Leane Call is her PCP in [place].  The patient has a long history of GERD. On 07/06/2012 she had an ultrasound which shows gallstones. She was apparently had some type of GI symptoms at that time. For the past 3 months she's been having accelerating symptoms of substernal chest discomfort, postprandial vomiting, early satiety. She states that she would sometimes she will eat something and then 2 or 3 hours later she'll have problems with belching and vomiting and discomfort that radiates to her back. She denies dysphagia or painful swallowing.  Recent GI valuation led to an upper endoscopy which showed a hiatal hernia, some small polyps, since some gastritis but really no mechanical problems or obstructive problems and no neoplastic problems. After the endoscopy the gastrologist referred her for surgical intervention. She is here today with her daughter.  Comorbidities include GERD, obesity, history of abdominal hysterectomy, history of tubal ligation, and hypertension. She denies any history of stroke, cardiac disease, diabetes, COPD, tobacco or alcohol use.   Operative Findings:       The gallbladder was chronically inflamed, clearly discolored, gallstones. Slightly thick walled. The cholangiogram was  basically normal, showing normal intrahepatic and extrahepatic biliary anatomy, no dilatation, no filling defect, and no obstruction with good flow of contrast into the duodenum. The liver, stomach, duodenum, small intestine, and large intestine were grossly normal. She had an umbilical hernia in the upper center of the umbilicus with some incarcerated omentum. This needed to be repaired to avoid progressive herniation in the future.  Procedure in Detail:          Following the induction of general endotracheal anesthesia the patient's abdomen was prepped and draped in a sterile fashion, surgical time out was performed, and  intravenous antibiotics were given. 0.5% Marcaine with epinephrine was used as local infiltration anesthetic.    A vertical incision was made in the lower rim of the umbilicus. The fascia was identified below the hernia and incised slightly until we entered the hernia space. We pushed the omentum away from the undersurface of the umbilicus and placed an 11 mm Hassan trocar and secured this with pursestring   suture of 0 Vicryl. Pneumoperitoneum was created. We were able to insert the video camera and manipulate this around the omental adhesions. There were no other adhesions within the abdomen that we could detect. An 11 mm trocar was placed in the subxiphoid region and two 5 mm trocars placed in the right upper quadrant. The gallbladder fundus was elevated. The infundibulum was retracted laterally.  Some adhesions were taken down. We dissected out the cystic duct and the cystic artery until we had a large window behind both the structures and a good critical view. Cystic artery was secured with metal clips and divided. A cholangiogram catheter was inserted into the cystic duct and a cholangiogram was obtained with the C-arm. This cholangiogram was normal as described above. The cholangiogram catheter was removed, the cystic duct was secured with multiple metal clips and divided. We found a  small posterior branch of the cystic artery and isolated it and divided between metal clips close to the gallbladder. The gallbladder was dissected from its bed with electrocautery placed in a specimen bag and  removed. The operative field was copiously irrigated. A couple of bleeding points on the gallbladder bed controlled with cautery. At the end of the case irrigation fluid was completely clear there was no bleeding or bile leak.      With the camera in the epigastric port I then took down the omental adhesions out of the incarcerated  Umbilical hernia. There was no bleeding. We then removed the trocars and released the pneumoperitoneum. I placed retractors in the umbilicus and enlarged the incision a little bit. Until the fascia was completely defined. I then closed the umbilical hernia defect primarily with about 7 interrupted sutures of #1 Novofil placed transversely. These were all placed and then tied and the repair felt good. The wounds were irrigated with saline. The skin incision were closed with subcuticular sutures of 4-0 Monocryl and Dermabond. The patient tolerated the procedure well was taken to recovery in stable condition. EBL 15 cc. Counts correct. Operations none.     Angelia MouldHaywood M. Derrell LollingIngram, M.D., FACS General and Minimally Invasive Surgery Breast and Colorectal Surgery  09/27/2013 10:53 AM

## 2013-09-27 NOTE — Interval H&P Note (Signed)
History and Physical Interval Note:  09/27/2013 8:38 AM  Gabrielle PinaHelen Doble  has presented today for surgery, with the diagnosis of gallstones  The goals and the various methods of treatment have been discussed with the patient and family. After consideration of risks, benefits and other options for treatment, the patient has consented to  Procedure(s): LAPAROSCOPIC CHOLECYSTECTOMY POSSIBLE IOC (N/A) as a surgical intervention .  The patient's history has been reviewed, patient examined, no change in status, stable for surgery.  I have reviewed the patient's chart and labs.  Questions were answered to the patient's satisfaction.     Ernestene MentionINGRAM,Damarion Mendizabal M

## 2013-09-28 ENCOUNTER — Encounter (HOSPITAL_COMMUNITY): Payer: Self-pay | Admitting: General Surgery

## 2013-09-28 MED ORDER — HYDROCODONE-ACETAMINOPHEN 5-325 MG PO TABS: 1.0000 | ORAL_TABLET | Freq: Four times a day (QID) | ORAL | Status: DC | PRN

## 2013-09-28 NOTE — Progress Notes (Signed)
DC instructions carefully reviewed with patient and daughter.  No changes noted since AM assessment.  IV DC'ed.  Lap sites clean, dry, and intact with dermabond.  Home med rec carefully reviewed-including which meds have been taken and which ones she will need to take tonight.  Patient has a f/u appt on 2/2 with Dr. Derrell LollingIngram.  Rx for pain given to daughter.  Patient and daughter verbalize understanding of all instructions and deny further questions.

## 2013-09-28 NOTE — Discharge Instructions (Signed)
-  see above 

## 2013-09-28 NOTE — Discharge Summary (Cosign Needed)
Patient ID: Gabrielle Brown [id number] 73 y.o. 20-Oct-1940  Admit date: 09/27/2013  Discharge date and time: No discharge date for patient encounter.  Admitting Physician: Ernestene Mention  Discharge Physician: Ernestene Mention  Admission Diagnoses: gallstones  Discharge Diagnoses: Gallstones Incarcerated umbilical hernia First degree AV block GERD History tubal ligation History of abdominal hysterectomy Hypertension  Operations: Procedure(s): LAPAROSCOPIC CHOLECYSTECTOMY, CHOLANGIOGRAM, REPAIR OF UMBILICAL HERNIA  Admission Condition: good  Discharged Condition: good  Indication for Admission: Gabrielle Brown is a 73 y.o. female. She is referred by Dr. Sue Lush, a gastroenterologist at [facility] for evaluation and surgical management of symptomatic gallstones. Dr. Leane Call is her PCP in [place].  The patient has a long history of GERD. On 07/06/2012 she had an ultrasound which shows gallstones. She was apparently had some type of GI symptoms at that time. For the past 3 months she's been having accelerating symptoms of substernal chest discomfort, postprandial vomiting, early satiety. She states that she would sometimes she will eat something and then 2 or 3 hours later she'll have problems with belching and vomiting and discomfort that radiates to her back. She denies dysphagia or painful swallowing.  Recent GI evaluation led to an upper endoscopy which showed a hiatal hernia, some small polyps, since some gastritis but really no mechanical problems or obstructive problems and no neoplastic problems. After the endoscopy the gastrologist referred her for surgical intervention. She is here today with her daughter.  Comorbidities include GERD, obesity, history of abdominal hysterectomy, history of tubal ligation, and hypertension. She denies any history of stroke, cardiac disease, diabetes, COPD, tobacco or alcohol use.   Hospital Course: On the day of  admission the patient was taken to the operating room and underwent a laparoscopic cholecystectomy with cholangiogram and repair of incarcerated umbilical hernia.The cholangiogram was unremarkable. There was a hernia at the umbilicus with incarcerated omentum which had to be dissected and debrided out.The hernia was repaired primarily.    In the PACU the anesthesiologist noticed that she had first-degree AV block but EKG was otherwise unremarkable. The anesthesiologist felt this was most likely due to a vagal reaction due to anesthesia and did not advise any further evaluation. The patient had no chest pain, shortness of breath or palpitations. Bmet and magnesium level were normal. The patient had one episode of vomiting after surgery but on postop day 1 she felt good and looked good and was hungry. She had been ambulating and was voiding uneventfully and she wanted to go home. Her abdomen was soft and benign and the wounds looked clean. She was given instructions in diet, activities, and followup. She was given a prescription for Norco 30 tablets for pain.  Consults: None  Significant Diagnostic Studies: radiology: intraoperative cholangiogram  Treatments: surgery: Laparoscopic cholecystectomy with cholangiogram, repair incarcerated umbilical hernia  Disposition: Home  Patient Instructions:    Medication List         atenolol 25 MG tablet  Commonly known as:  TENORMIN  Take 25 mg by mouth 2 (two) times daily.     BENICAR HCT 20-12.5 MG per tablet  Generic drug:  olmesartan-hydrochlorothiazide  Take 1 tablet by mouth every morning.     HYDROcodone-acetaminophen 5-325 MG per tablet  Commonly known as:  NORCO  Take 1-2 tablets by mouth every 6 (six) hours as needed.     omeprazole 20 MG capsule  Commonly known as:  PRILOSEC  Take 20 mg by mouth daily.     PREMARIN 0.625 MG tablet  Generic drug:  estrogens (conjugated)  Take 0.625 mg by mouth every morning.     zolpidem 10 MG tablet   Commonly known as:  AMBIEN  Take 10 mg by mouth at bedtime as needed for sleep.        Activity: no heavy lifting for 4 weeks. No driving for 2-3 days. Diet: low fat, low cholesterol diet Wound Care: none needed  Follow-up:  With Dr. Derrell LollingIngram in 3 weeks.  Signed: Angelia MouldHaywood M. Derrell LollingIngram, M.D., FACS General and minimally invasive surgery Breast and Colorectal Surgery  09/28/2013, 6:26 AM

## 2013-10-10 ENCOUNTER — Ambulatory Visit (INDEPENDENT_AMBULATORY_CARE_PROVIDER_SITE_OTHER): Payer: Medicare Other | Admitting: General Surgery

## 2013-10-10 ENCOUNTER — Encounter (INDEPENDENT_AMBULATORY_CARE_PROVIDER_SITE_OTHER): Payer: Self-pay | Admitting: General Surgery

## 2013-10-10 VITALS — BP 126/78 | HR 76 | Temp 98.1°F | Resp 14 | Ht 66.0 in | Wt 209.6 lb

## 2013-10-10 DIAGNOSIS — K8020 Calculus of gallbladder without cholecystitis without obstruction: Secondary | ICD-10-CM

## 2013-10-10 NOTE — Patient Instructions (Signed)
You are recovering from your gallbladder surgery and the surgery to repair your umbilical hernia without any obvious complications.  You may drive your car.  You may bathe anyway you wish.  Take a walk around the block every day.  Return to see Dr. Derrell LollingIngram as needed.

## 2013-10-10 NOTE — Progress Notes (Signed)
Patient ID: Gabrielle PinaHelen Brown, female   DOB: 1941/01/26, 73 y.o.   MRN: [id number] History: This patient underwent laparoscopic cholecystectomy with cholangiogram and primary repair of incarcerated umbilical hernia for 09/27/2013. Final pathology shows chronic cholecystitis with cholelithiasis. She is doing well. Appetite is essentially normal. Bowel function normal. No wound problems  Exam: Patient looks well. Daughter is with her. Abdomen is soft and nontender. All the trocar sites are healing normally.  Assessment: Chronic cholecystitis with cholelithiasis  incarcerated umbilical hernia Recovering uneventfully following above described surgery Obesity Hypertension GERD  Plan: Diet and activities discussed. Return to see me as needed.    Angelia MouldHaywood M. Derrell LollingIngram, M.D., [facility] General and Minimally invasive Surgery Breast and Colorectal Surgery Office:   [phone] Pager:   [phone]

## 2014-07-31 IMAGING — CR DG CHEST 2V
2 series · 2 of 2 positions shown · non-contrast
Comparison: None.

CLINICAL DATA: Preoperative study, history of hypertension

EXAM:
CHEST  2 VIEW

[w chest pa]
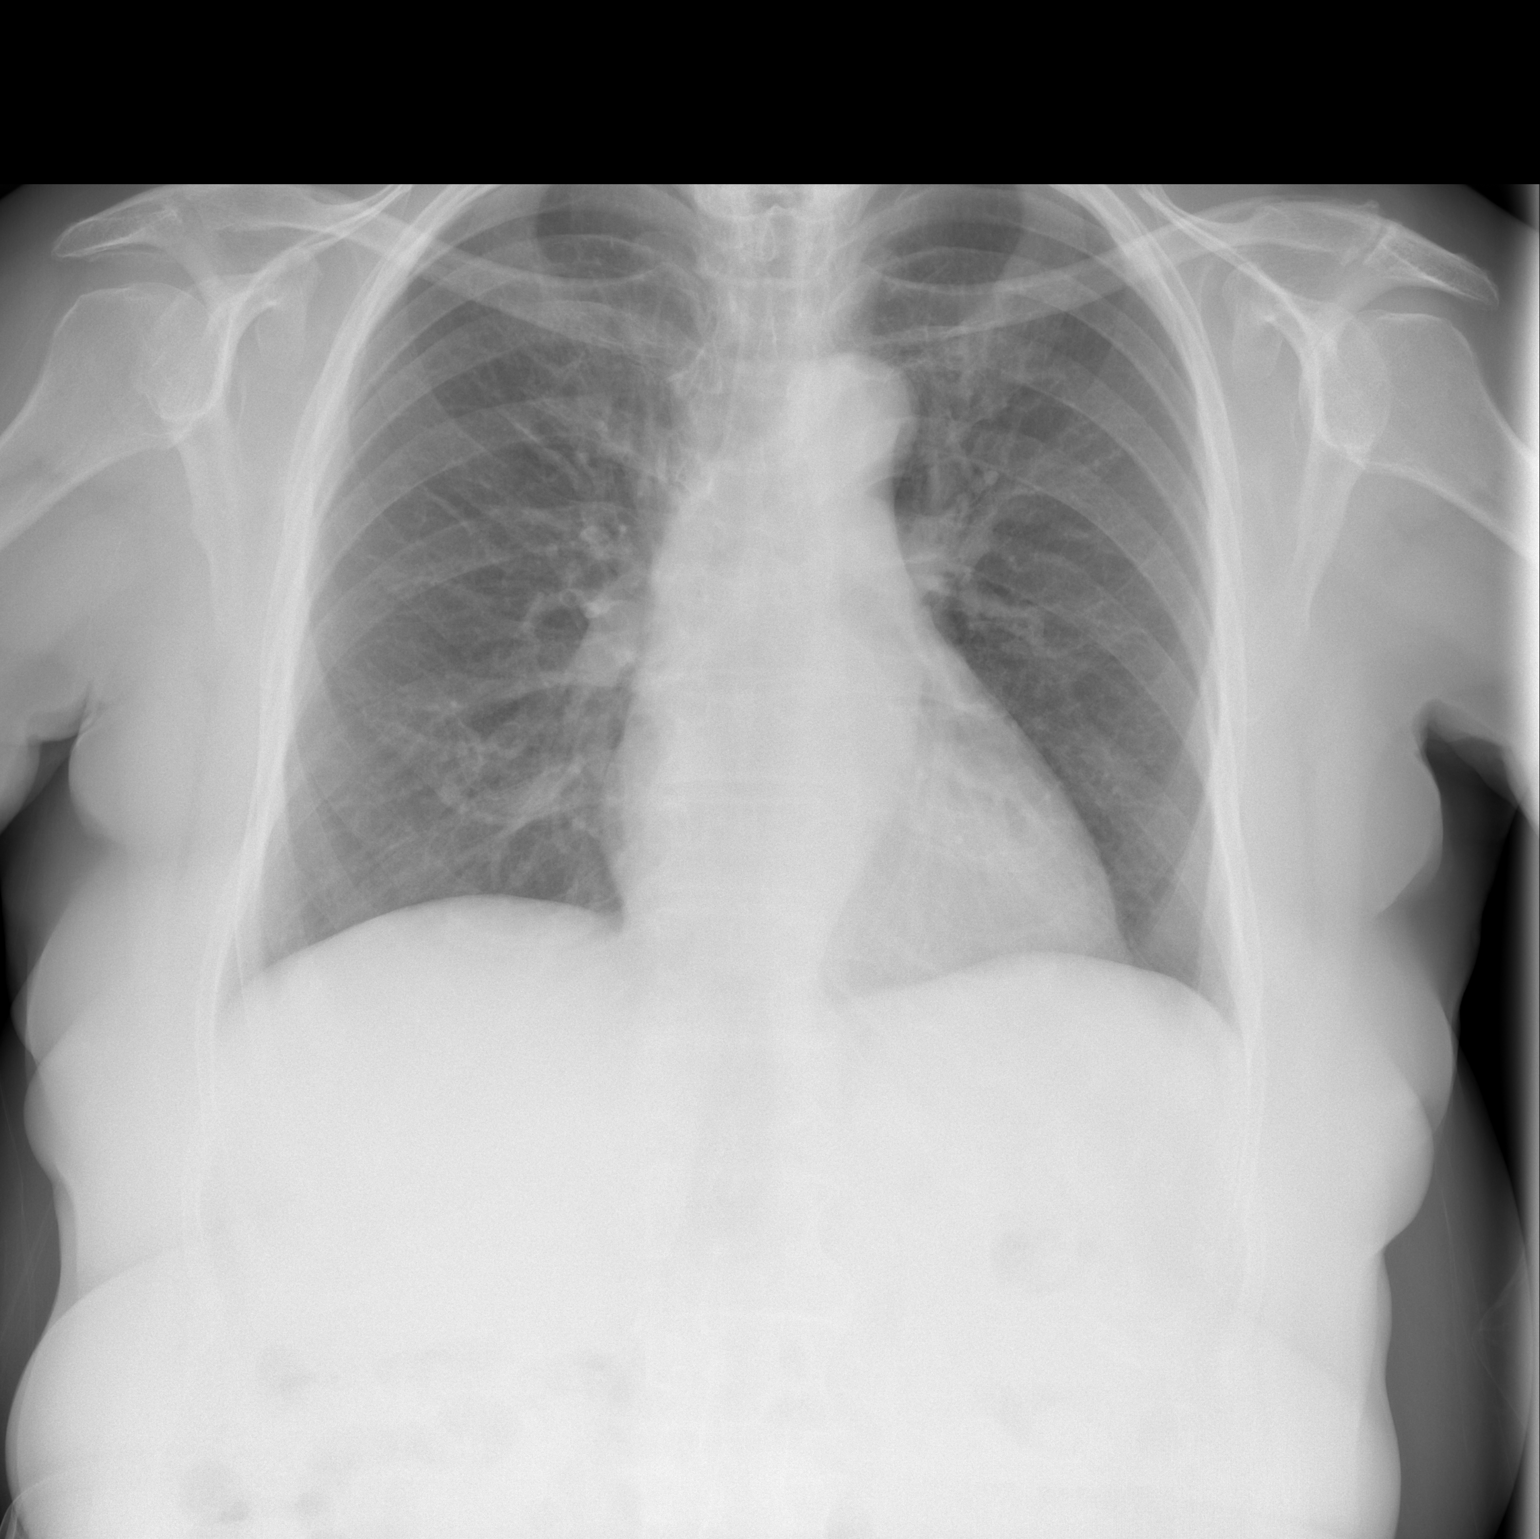

[w chest lat *]
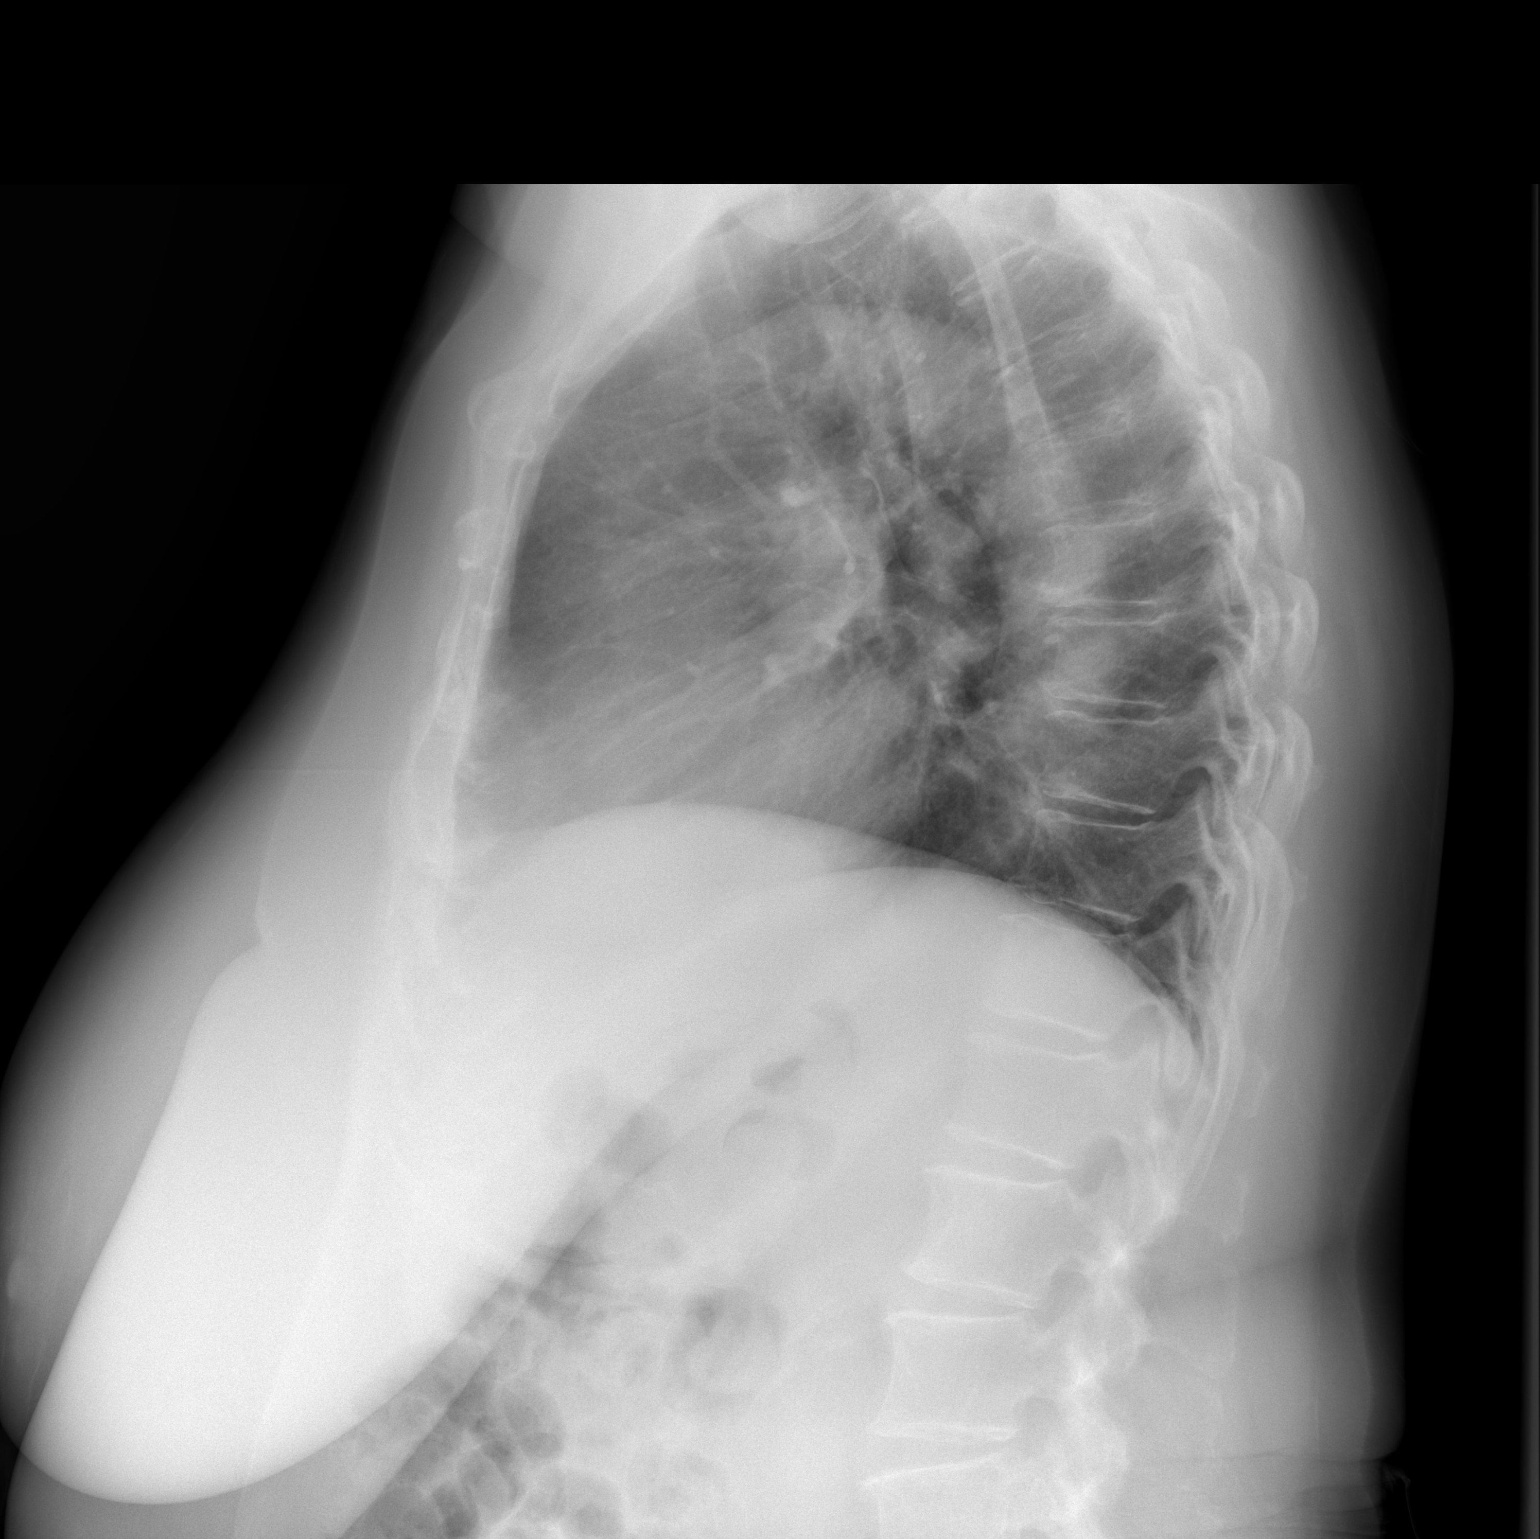

[2 of 2 positions shown; findings below may reference images not displayed]

FINDINGS: The lungs are adequately inflated. There is no focal infiltrate. The
cardiac silhouette is normal in size. The pulmonary vascularity is
not engorged. The mediastinum is normal in width. There is mild
tortuosity of the descending thoracic aorta. There is no pleural
effusion or pneumothorax. The observed portions of the bony thorax
appear normal.
IMPRESSION: There is no evidence of active cardiopulmonary disease.

## 2017-10-11 ENCOUNTER — Inpatient Hospital Stay (HOSPITAL_COMMUNITY)
Admission: EM | Admit: 2017-10-11 | Discharge: 2017-10-14 | DRG: 100 | Disposition: A | Discharge status: Home / Self Care | Payer: Medicare Other | Attending: Internal Medicine | Admitting: Internal Medicine

## 2017-10-11 ENCOUNTER — Emergency Department (HOSPITAL_COMMUNITY): Payer: Medicare Other

## 2017-10-11 ENCOUNTER — Encounter (HOSPITAL_COMMUNITY): Payer: Self-pay | Admitting: Emergency Medicine

## 2017-10-11 ENCOUNTER — Other Ambulatory Visit: Payer: Self-pay

## 2017-10-11 DIAGNOSIS — R4781 Slurred speech: Secondary | ICD-10-CM

## 2017-10-11 DIAGNOSIS — R9431 Abnormal electrocardiogram [ECG] [EKG]: Secondary | ICD-10-CM | POA: Diagnosis not present

## 2017-10-11 DIAGNOSIS — R569 Unspecified convulsions: Principal | ICD-10-CM | POA: Diagnosis present

## 2017-10-11 DIAGNOSIS — R29810 Facial weakness: Secondary | ICD-10-CM | POA: Diagnosis present

## 2017-10-11 DIAGNOSIS — I10 Essential (primary) hypertension: Secondary | ICD-10-CM | POA: Diagnosis not present

## 2017-10-11 DIAGNOSIS — R531 Weakness: Secondary | ICD-10-CM | POA: Diagnosis not present

## 2017-10-11 DIAGNOSIS — W19XXXA Unspecified fall, initial encounter: Secondary | ICD-10-CM | POA: Diagnosis present

## 2017-10-11 DIAGNOSIS — I639 Cerebral infarction, unspecified: Secondary | ICD-10-CM | POA: Diagnosis not present

## 2017-10-11 DIAGNOSIS — Z79899 Other long term (current) drug therapy: Secondary | ICD-10-CM

## 2017-10-11 DIAGNOSIS — R296 Repeated falls: Secondary | ICD-10-CM | POA: Diagnosis present

## 2017-10-11 DIAGNOSIS — R946 Abnormal results of thyroid function studies: Secondary | ICD-10-CM | POA: Diagnosis present

## 2017-10-11 DIAGNOSIS — G9341 Metabolic encephalopathy: Secondary | ICD-10-CM | POA: Diagnosis present

## 2017-10-11 DIAGNOSIS — G9340 Encephalopathy, unspecified: Secondary | ICD-10-CM | POA: Diagnosis present

## 2017-10-11 DIAGNOSIS — R4182 Altered mental status, unspecified: Secondary | ICD-10-CM

## 2017-10-11 DIAGNOSIS — E785 Hyperlipidemia, unspecified: Secondary | ICD-10-CM | POA: Diagnosis present

## 2017-10-11 DIAGNOSIS — Z6832 Body mass index (BMI) 32.0-32.9, adult: Secondary | ICD-10-CM

## 2017-10-11 DIAGNOSIS — E039 Hypothyroidism, unspecified: Secondary | ICD-10-CM | POA: Diagnosis present

## 2017-10-11 DIAGNOSIS — Z90710 Acquired absence of both cervix and uterus: Secondary | ICD-10-CM

## 2017-10-11 DIAGNOSIS — E663 Overweight: Secondary | ICD-10-CM | POA: Diagnosis present

## 2017-10-11 DIAGNOSIS — Y92009 Unspecified place in unspecified non-institutional (private) residence as the place of occurrence of the external cause: Secondary | ICD-10-CM

## 2017-10-11 DIAGNOSIS — I4581 Long QT syndrome: Secondary | ICD-10-CM | POA: Diagnosis present

## 2017-10-11 DIAGNOSIS — Z79890 Hormone replacement therapy (postmenopausal): Secondary | ICD-10-CM

## 2017-10-11 DIAGNOSIS — D519 Vitamin B12 deficiency anemia, unspecified: Secondary | ICD-10-CM | POA: Diagnosis present

## 2017-10-11 DIAGNOSIS — F0390 Unspecified dementia without behavioral disturbance: Secondary | ICD-10-CM | POA: Diagnosis present

## 2017-10-11 HISTORY — DX: Unspecified dementia, unspecified severity, without behavioral disturbance, psychotic disturbance, mood disturbance, and anxiety: F03.90

## 2017-10-11 LAB — I-STAT CHEM 8, ED
BUN: 14 mg/dL (ref 6–20)
CALCIUM ION: 1.14 mmol/L — AB (ref 1.15–1.40)
CHLORIDE: 103 mmol/L (ref 101–111)
CREATININE: 1 mg/dL (ref 0.44–1.00)
GLUCOSE: 93 mg/dL (ref 65–99)
HCT: 35 % — ABNORMAL LOW (ref 36.0–46.0)
HEMOGLOBIN: 11.9 g/dL — AB (ref 12.0–15.0)
Potassium: 4.3 mmol/L (ref 3.5–5.1)
Sodium: 139 mmol/L (ref 135–145)
TCO2: 25 mmol/L (ref 22–32)

## 2017-10-11 LAB — URINALYSIS, ROUTINE W REFLEX MICROSCOPIC
Bilirubin Urine: NEGATIVE
Glucose, UA: NEGATIVE mg/dL
Hgb urine dipstick: NEGATIVE
Ketones, ur: NEGATIVE mg/dL
Leukocytes, UA: NEGATIVE
NITRITE: NEGATIVE
PROTEIN: NEGATIVE mg/dL
SPECIFIC GRAVITY, URINE: 1.012 (ref 1.005–1.030)
pH: 5 (ref 5.0–8.0)

## 2017-10-11 LAB — COMPREHENSIVE METABOLIC PANEL
ALK PHOS: 63 U/L (ref 38–126)
ALT: 15 U/L (ref 14–54)
AST: 26 U/L (ref 15–41)
Albumin: 3.6 g/dL (ref 3.5–5.0)
Anion gap: 10 (ref 5–15)
BILIRUBIN TOTAL: 0.7 mg/dL (ref 0.3–1.2)
BUN: 15 mg/dL (ref 6–20)
CALCIUM: 9.3 mg/dL (ref 8.9–10.3)
CO2: 26 mmol/L (ref 22–32)
CREATININE: 0.97 mg/dL (ref 0.44–1.00)
Chloride: 102 mmol/L (ref 101–111)
GFR, EST NON AFRICAN AMERICAN: 55 mL/min — AB (ref >60)
Glucose, Bld: 98 mg/dL (ref 65–99)
Potassium: 4.1 mmol/L (ref 3.5–5.1)
Sodium: 138 mmol/L (ref 135–145)
TOTAL PROTEIN: 6.8 g/dL (ref 6.5–8.1)

## 2017-10-11 LAB — RAPID URINE DRUG SCREEN, HOSP PERFORMED
Amphetamines: NOT DETECTED
BARBITURATES: NOT DETECTED
Benzodiazepines: NOT DETECTED
Cocaine: NOT DETECTED
Opiates: NOT DETECTED
Tetrahydrocannabinol: NOT DETECTED

## 2017-10-11 LAB — PROTIME-INR
INR: 1
Prothrombin Time: 13.1 seconds (ref 11.4–15.2)

## 2017-10-11 LAB — CBC
HEMATOCRIT: 33.4 % — AB (ref 36.0–46.0)
HEMOGLOBIN: 10.4 g/dL — AB (ref 12.0–15.0)
MCH: 22.5 pg — ABNORMAL LOW (ref 26.0–34.0)
MCHC: 31.1 g/dL (ref 30.0–36.0)
MCV: 72.1 fL — AB (ref 78.0–100.0)
Platelets: 166 10*3/uL (ref 150–400)
RBC: 4.63 MIL/uL (ref 3.87–5.11)
RDW: 14.9 % (ref 11.5–15.5)
WBC: 6.6 10*3/uL (ref 4.0–10.5)

## 2017-10-11 LAB — TROPONIN I
Troponin I: <0.03 ng/mL (ref <0.03)
Troponin I: <0.03 ng/mL (ref <0.03)

## 2017-10-11 LAB — I-STAT TROPONIN, ED: TROPONIN I, POC: 0 ng/mL (ref 0.00–0.08)

## 2017-10-11 LAB — DIFFERENTIAL
BASOS PCT: 1 %
Basophils Absolute: 0 10*3/uL (ref 0.0–0.1)
Eosinophils Absolute: 0.1 10*3/uL (ref 0.0–0.7)
Eosinophils Relative: 2 %
LYMPHS ABS: 1.7 10*3/uL (ref 0.7–4.0)
LYMPHS PCT: 26 %
MONO ABS: 0.7 10*3/uL (ref 0.1–1.0)
MONOS PCT: 10 %
NEUTROS ABS: 4 10*3/uL (ref 1.7–7.7)
Neutrophils Relative %: 61 %

## 2017-10-11 LAB — ETHANOL: Alcohol, Ethyl (B): <10 mg/dL (ref <10)

## 2017-10-11 LAB — APTT: aPTT: 26 seconds (ref 24–36)

## 2017-10-11 LAB — MAGNESIUM: MAGNESIUM: 1.8 mg/dL (ref 1.7–2.4)

## 2017-10-11 LAB — VITAMIN B12: VITAMIN B 12: 134 pg/mL — AB (ref 180–914)

## 2017-10-11 LAB — TSH: TSH: 6.208 u[IU]/mL — ABNORMAL HIGH (ref 0.350–4.500)

## 2017-10-11 LAB — AMMONIA: Ammonia: 11 umol/L (ref 9–35)

## 2017-10-11 MED ORDER — ACETAMINOPHEN 160 MG/5ML PO SOLN
650.0000 mg | ORAL | Status: DC | PRN
Start: 2017-10-11 — End: 2017-10-14

## 2017-10-11 MED ORDER — ATORVASTATIN CALCIUM 40 MG PO TABS
80.0000 mg | ORAL_TABLET | Freq: Every day | ORAL | Status: DC
  Administered 2017-10-11: 80 mg via ORAL
  Filled 2017-10-11 (×3): qty 2

## 2017-10-11 MED ORDER — ASPIRIN 300 MG RE SUPP
300.0000 mg | Freq: Every day | RECTAL | Status: DC
  Filled 2017-10-11: qty 1

## 2017-10-11 MED ORDER — ENOXAPARIN SODIUM 40 MG/0.4ML ~~LOC~~ SOLN
40.0000 mg | SUBCUTANEOUS | Status: DC
  Administered 2017-10-11 – 2017-10-12 (×2): 40 mg via SUBCUTANEOUS
  Filled 2017-10-11 (×2): qty 0.4

## 2017-10-11 MED ORDER — ACETAMINOPHEN 325 MG PO TABS: 650.0000 mg | ORAL_TABLET | ORAL | Status: DC | PRN

## 2017-10-11 MED ORDER — STROKE: EARLY STAGES OF RECOVERY BOOK
Freq: Once | Status: AC
  Administered 2017-10-11: 17:00:00
  Filled 2017-10-11: qty 1

## 2017-10-11 MED ORDER — SODIUM CHLORIDE 0.9 % IV SOLN
INTRAVENOUS | Status: DC
  Administered 2017-10-11 – 2017-10-13 (×3): via INTRAVENOUS

## 2017-10-11 MED ORDER — SENNOSIDES-DOCUSATE SODIUM 8.6-50 MG PO TABS: 1.0000 | ORAL_TABLET | Freq: Every evening | ORAL | Status: DC | PRN

## 2017-10-11 MED ORDER — ACETAMINOPHEN 650 MG RE SUPP
650.0000 mg | RECTAL | Status: DC | PRN
Start: 2017-10-11 — End: 2017-10-14

## 2017-10-11 MED ORDER — ASPIRIN 325 MG PO TABS
325.0000 mg | ORAL_TABLET | Freq: Every day | ORAL | Status: DC
  Administered 2017-10-11: 325 mg via ORAL
  Filled 2017-10-11 (×6): qty 1

## 2017-10-11 MED ORDER — LEVOTHYROXINE SODIUM 100 MCG IV SOLR
25.0000 ug | Freq: Every day | INTRAVENOUS | Status: DC
  Administered 2017-10-11 – 2017-10-12 (×2): 25 ug via INTRAVENOUS
  Filled 2017-10-11 (×5): qty 5

## 2017-10-11 MED ORDER — LORAZEPAM 2 MG/ML IJ SOLN
1.0000 mg | INTRAMUSCULAR | Status: DC | PRN
  Administered 2017-10-11 – 2017-10-12 (×4): 1 mg via INTRAVENOUS
  Filled 2017-10-11 (×5): qty 1

## 2017-10-11 NOTE — H&P (Addendum)
TRH H&P   Patient Demographics:    Gabrielle Brown, is a 77 y.o. female  MRN: [id number]   DOB - Jul 10, 1941  Admit Date - 10/11/2017  Outpatient Primary MD for the patient is Arlina Robes, MD  Referring MD Dr. Hyacinth Meeker  Outpatient Specialists: None  Patient coming from: Home  Chief Complaint  Patient presents with  . Weakness      HPI:    Gabrielle Brown  is a 77 y.o. female, with history of hypertension, hypothyroidism and mild dementia who lives with her daughter was brought to the ED for recurrent falls in the past 3 days.  On 1/31 patient was found on the floor by her daughter after she returned from work she was complaining of pain in her back and daughter also noted some facial droop so she took her to [facility] where patient had a head CT along with blood work done which was negative and was discharged home.  Daughter found that since then patient was weak with impaired speech with confusion. Again last night she had a fall in her room which was unwitnessed.  Daughter went to see her and she was alert and awake but very confused and talking incoherently, unable to recognize her.  She again noticed some facial droop but no other change.  Patient has chronic medial conjugate gaze.  Daughter denies any recent plane of headaches, blurred vision, dizziness, nausea, vomiting, chest pain, palpitations, fevers, chills, abdominal pain, dysuria, diarrhea.  Daughter denied noticing any seizure-like activity, bowel or urinary incontinence. At baseline patient per daughter has mild memory deficit but is usually quite coherent, well capable of her ADLs and ambulates without assistance.  Denies any new medications, recent travel or recent illness.  Course in the ED Vitals were stable.  Blood work showed hemoglobin of 11.9, normal chemistry.  UA and urine drug screen were negative.   Head CT was done which was negative for acute abnormality and showed moderate diffuse cerebral atrophy and mild diffuse cerebellar atrophy with moderate chronic small vessel white matter ischemic changes.   hospitalist consulted for workup  of possible acute stroke.     Review of systems:    Review of symptoms review of systems limited due to patient's severe confusion.  As outlined in HPI No Fever-chills, No Headache, No changes with Vision or hearing, No problems swallowing food or Liquids, No Chest pain, Cough or Shortness of Breath, No Abdominal pain, No Nausea or vomiting, Bowel movements are regular, No Blood in stool or Urine, No dysuria, No new skin rashes or bruises, No new joints pains-aches,  Fall at home, no new weakness, tingling, numbness in any extremity, No recent weight gain or loss, No polyuria, polydypsia or polyphagia, No significant Mental Stressors.    With Past History of the following :    Past Medical History:  Diagnosis Date  .  Dementia   . Gallstones    PT STATES SHE DOES NOT HAVE ANY APPETITE AND HAS HAD SOME N&V.  Marland Kitchen Hypertension   . Stiffness in joint    BILATERAL KNEE STIFFNESS - STATES NO PAIN BUT SHE GETS INJECTIONS IN HER KNEES TO HELP WITH STIFFNESS      Past Surgical History:  Procedure Laterality Date  . ABDOMINAL HYSTERECTOMY    . CHOLECYSTECTOMY N/A 09/27/2013   Procedure: LAPAROSCOPIC CHOLECYSTECTOMY, CHOLANGIOGRAM, REPAIR OF UMBILICAL HERNIA;  Surgeon: Ernestene Mention, MD;  Location: [facility] ORS;  Service: General;  Laterality: N/A;  . HERNIA REPAIR        Social History:     Social History   Tobacco Use  . Smoking status: Never Smoker  . Smokeless tobacco: Never Used  Substance Use Topics  . Alcohol use: No     Lives -home with daughter  Mobility -independent previously     Family History :   No family history of heart disease or stroke.   Home Medications:   Prior to Admission medications   Medication Sig  Start Date End Date Taking? Authorizing Provider  atenolol (TENORMIN) 25 MG tablet Take 25 mg by mouth 2 (two) times daily.  08/26/13  Yes [provider]  BENICAR HCT 20-12.5 MG per tablet Take 1 tablet by mouth every morning.  09/12/13  Yes [provider]  levothyroxine (SYNTHROID, LEVOTHROID) 50 MCG tablet Take 50 mcg by mouth daily before breakfast.   Yes [provider]  traZODone (DESYREL) 100 MG tablet Take 100 mg by mouth at bedtime.   Yes [provider]  zolpidem (AMBIEN) 10 MG tablet Take 10 mg by mouth at bedtime as needed for sleep.  08/10/13  Yes [provider]     Allergies:    No Known Allergies   Physical Exam:   Vitals  Blood pressure 125/77, pulse 66, temperature 98.2 F (36.8 C), temperature source Axillary, resp. rate 14, height 5\' 7"  (1.702 m), weight 93 kg (205 lb), SpO2 98 %.   General: Elderly female lying in bed extremely confused, talking nonstop and incoherently HEENT: Pupils reactive bilaterally, has medial conjugate gaze (per daughter this is chronic), no pallor, no icterus, moist oral mucosa, supple neck Chest: Clear to auscultation bilaterally, no added sounds CVS: Normal S1 and S2, no murmurs rubs gallops GI: Soft, nondistended, nontender, bowel sounds present Musculoskeletal: Warm, no edema, normal skin CNS: Alert and awake oriented x0, incoherent speech, moving all extremities, normal muscle tone and reflex, cerebellar function and gait not assessed.    Data Review:    CBC Recent Labs  Lab 10/11/17 0936 10/11/17 0958  WBC 6.6  --   HGB 10.4* 11.9*  HCT 33.4* 35.0*  PLT 166  --   MCV 72.1*  --   MCH 22.5*  --   MCHC 31.1  --   RDW 14.9  --   LYMPHSABS 1.7  --   MONOABS 0.7  --   EOSABS 0.1  --   BASOSABS 0.0  --    ------------------------------------------------------------------------------------------------------------------  Chemistries  Recent Labs  Lab 10/11/17 0936 10/11/17 0958   NA 138 139  K 4.1 4.3  CL 102 103  CO2 26  --   GLUCOSE 98 93  BUN 15 14  CREATININE 0.97 1.00  CALCIUM 9.3  --   AST 26  --   ALT 15  --   ALKPHOS 63  --   BILITOT 0.7  --    ------------------------------------------------------------------------------------------------------------------  estimated creatinine clearance is 55.2 mL/min (by C-G formula based on SCr of 1 mg/dL). ------------------------------------------------------------------------------------------------------------------ No results for input(s): TSH, T4TOTAL, T3FREE, THYROIDAB in the last 72 hours.  Invalid input(s): FREET3  Coagulation profile Recent Labs  Lab 10/11/17 0936  INR 1.00   ------------------------------------------------------------------------------------------------------------------- No results for input(s): DDIMER in the last 72 hours. -------------------------------------------------------------------------------------------------------------------  Cardiac Enzymes No results for input(s): CKMB, TROPONINI, MYOGLOBIN in the last 168 hours.  Invalid input(s): CK ------------------------------------------------------------------------------------------------------------------ No results found for: BNP   ---------------------------------------------------------------------------------------------------------------  Urinalysis    Component Value Date/Time   COLORURINE YELLOW 10/11/2017 1000   APPEARANCEUR CLEAR 10/11/2017 1000   LABSPEC 1.012 10/11/2017 1000   PHURINE 5.0 10/11/2017 1000   GLUCOSEU NEGATIVE 10/11/2017 1000   HGBUR NEGATIVE 10/11/2017 1000   BILIRUBINUR NEGATIVE 10/11/2017 1000   KETONESUR NEGATIVE 10/11/2017 1000   PROTEINUR NEGATIVE 10/11/2017 1000   NITRITE NEGATIVE 10/11/2017 1000   LEUKOCYTESUR NEGATIVE 10/11/2017 1000    ----------------------------------------------------------------------------------------------------------------   Imaging Results:     Ct Head Wo Contrast  Result Date: 10/11/2017 CLINICAL DATA:  Altered mental status. Generalized weakness. Multiple recent falls. EXAM: CT HEAD WITHOUT CONTRAST TECHNIQUE: Contiguous axial images were obtained from the base of the skull through the vertex without intravenous contrast. COMPARISON:  None. FINDINGS: Brain: Diffusely enlarged ventricles and subarachnoid spaces. Patchy white matter low density in both cerebral hemispheres. No intracranial hemorrhage, mass lesion or CT evidence of acute infarction. Vascular: No hyperdense vessel or unexpected calcification. Skull: Normal. Negative for fracture or focal lesion. Sinuses/Orbits: Dysconjugate gaze with both eyes directed medially. Normally pneumatized paranasal sinuses. Other: None. IMPRESSION: 1. No acute abnormality. 2. Moderate diffuse cerebral atrophy and mild diffuse cerebellar atrophy. 3. Moderate chronic small vessel white matter ischemic changes in both cerebral hemispheres. 4. Dysconjugate gaze. Electronically Signed   By: Beckie SaltsSteven  Reid M.D.   On: 10/11/2017 11:48    My personal review of EKG: Normal sinus rhythm at 64 with diffuse lateral T wave inversion.  Prolonged QTC of 620.   Assessment & Plan:    Principal Problem:   Acute encephalopathy Highly suspicious for acute to subacute stroke.  Also cannot rule out acute seizures with postictal confusion. Observe on telemetry.  Head CT without acute findings.  MRI brain/MRA head ordered which should be done tomorrow morning.  full stroke workup with 2D echo, carotid ultrasound.  PT/OT eval, SLP eval, check A1c and lipid panel. Placed on full dose aspirin and Lipitor 80 mg daily.  Allow permissive blood pressure.  Obtain EEG. Fall and seizure precaution.  Neurochecks. Neurology consult in a.m.  Check TSH, B12, RPR, HIV and pneumonia.  Active Problems:    Essential hypertension Hold home blood pressure medication to allow permissive hypertension  Prolonged QTC with lateral T  wave inversion. New from prior EKG in 2015.  Hold trazodone and avoid QT prolonging agent.  Check magnesium and replenished.  Follow 2D echo and cycle cardiac enzymes.  Hypothyroidism Switch to IV Synthroid.  Check TSH.  DVT Prophylaxis: Lovenox  AM Labs Ordered, also please review Full Orders  Family Communication: Admission, patients condition and plan of care including tests being ordered have been discussed with daughter at bedside   Code Status full code  Likely DC to home  Condition: Fair  Consults called: Neurology  Admission status: Observation  Time spent in minutes : 50   Billie Intriago M.D on 10/11/2017 at 1:54 PM  Between 7am to 7pm - Pager - [phone]. After 7pm go to [email or web address] -  password [facility]  [facility] - Office  [phone]

## 2017-10-11 NOTE — ED Triage Notes (Signed)
PT has dementia and lives with her daughter. Family reports recurrent falls with generalized weakness and altered mental status and unable to ambulate x3 days. [facility] Thursday and was d/c.

## 2017-10-11 NOTE — ED Notes (Signed)
UTA to get good neuro assessment.  Pt has dementia and does not follow commands.

## 2017-10-11 NOTE — ED Provider Notes (Signed)
[facility] EMERGENCY DEPARTMENT Provider Note   CSN: [id number] Arrival date & time: 10/11/17  2130     History   Chief Complaint Chief Complaint  Patient presents with  . Weakness    HPI Gabrielle Brown is a 77 y.o. female.  HPI  The patient is a 77 year old female, she has a history of dementia, level 5 caveat applies secondary to this condition.  The daughter who is her primary caregiver is the primary historian as well and states that several days ago on Thursday when she got home from work they had to take Gabrielle Brown to the hospital in [place] because of a fall.  There was concern that she may have had an injury to her lower back or her head, a CT scan of the brain and the back were normal, she had lab work done which was unremarkable this is all per the daughter's report.  Over the last several days after going home she noticed that she is not return to her normal baseline which is able to walk, able to prepare her own food, able to take care of many of her activities of daily living.  She has continued to be a fall risk and had another fall this morning, she has continued to have slurred speech which was something that the daughter states was present on her initial evaluation as well and she continues to be more confused and disoriented than usual.  The patient is unable to tell you if she has weakness or numbness, she just states do not touch me you are making me cold no matter what you do.  This is pretty much the only thing that she says in the room.  The daughter states that she talks constantly as part of her advanced dementia and that has not changed that much but the quality of her speech has decompensated and she continues to slur her words in a garbled way.  She states that has gradually improved but it is not back to normal.  Past Medical History:  Diagnosis Date  . Dementia   . Gallstones    PT STATES SHE DOES NOT HAVE ANY APPETITE AND HAS HAD SOME N&V.  Marland Kitchen  Hypertension   . Stiffness in joint    BILATERAL KNEE STIFFNESS - STATES NO PAIN BUT SHE GETS INJECTIONS IN HER KNEES TO HELP WITH STIFFNESS    Patient Active Problem List   Diagnosis Date Noted  . Acute encephalopathy 10/11/2017  . Gallstones 09/13/2013    Past Surgical History:  Procedure Laterality Date  . ABDOMINAL HYSTERECTOMY    . CHOLECYSTECTOMY N/A 09/27/2013   Procedure: LAPAROSCOPIC CHOLECYSTECTOMY, CHOLANGIOGRAM, REPAIR OF UMBILICAL HERNIA;  Surgeon: Ernestene Mention, MD;  Location: [facility];  Service: General;  Laterality: N/A;  . HERNIA REPAIR      OB History    Gravida Para Term Preterm AB Living             2   SAB TAB Ectopic Multiple Live Births                   Home Medications    Prior to Admission medications   Medication Sig Start Date End Date Taking? Authorizing Provider  atenolol (TENORMIN) 25 MG tablet Take 25 mg by mouth 2 (two) times daily.  08/26/13  Yes [provider]  BENICAR HCT 20-12.5 MG per tablet Take 1 tablet by mouth every morning.  09/12/13  Yes [provider]  levothyroxine (  SYNTHROID, LEVOTHROID) 50 MCG tablet Take 50 mcg by mouth daily before breakfast.   Yes [provider]  traZODone (DESYREL) 100 MG tablet Take 100 mg by mouth at bedtime.   Yes [provider]  zolpidem (AMBIEN) 10 MG tablet Take 10 mg by mouth at bedtime as needed for sleep.  08/10/13  Yes [provider]    Family History History reviewed. No pertinent family history.  Social History Social History   Tobacco Use  . Smoking status: Never Smoker  . Smokeless tobacco: Never Used  Substance Use Topics  . Alcohol use: No  . Drug use: No     Allergies   Patient has no known allergies.   Review of Systems Review of Systems  All other systems reviewed and are negative.    Physical Exam Updated Vital Signs BP (!) 143/88   Pulse 64   Temp 98.2 F (36.8 C) (Axillary)   Resp 18   Ht 5\' 7"  (1.702 m)   Wt  93 kg (205 lb)   SpO2 97%   BMI 32.11 kg/m   Physical Exam  Constitutional: She appears well-developed and well-nourished. No distress.  HENT:  Head: Normocephalic and atraumatic.  Mouth/Throat: Oropharynx is clear and moist. No oropharyngeal exudate.  The patient refuses to open her mouth for me, she does not appear to have any facial droop  Eyes: Conjunctivae and EOM are normal. Pupils are equal, round, and reactive to light. Right eye exhibits no discharge. Left eye exhibits no discharge. No scleral icterus.  Neck: Normal range of motion. Neck supple. No JVD present. No thyromegaly present.  Cardiovascular: Normal rate, regular rhythm, normal heart sounds and intact distal pulses. Exam reveals no gallop and no friction rub.  No murmur heard. Pulmonary/Chest: Effort normal and breath sounds normal. No respiratory distress. She has no wheezes. She has no rales.  Abdominal: Soft. Bowel sounds are normal. She exhibits no distension and no mass. There is no tenderness.  Musculoskeletal: Normal range of motion. She exhibits no edema or tenderness.  There is mild swelling of the bilateral ankles, but I do not see any asymmetry, she is able to move all 4 extremities to painful stimuli but does not move them to command.  Lymphadenopathy:    She has no cervical adenopathy.  Neurological: She is alert. Coordination normal.  Speech is slightly slurred, she talks constantly, she does not answer questions when asked Dysconjugate gaze  Skin: Skin is warm and dry. No rash noted. No erythema.  Psychiatric: She has a normal mood and affect. Her behavior is normal.  Nursing note and vitals reviewed.    ED Treatments / Results  Labs (all labs ordered are listed, but only abnormal results are displayed) Labs Reviewed  CBC - Abnormal; Notable for the following components:      Result Value   Hemoglobin 10.4 (*)    HCT 33.4 (*)    MCV 72.1 (*)    MCH 22.5 (*)    All other components within normal  limits  COMPREHENSIVE METABOLIC PANEL - Abnormal; Notable for the following components:   GFR calc non Af Amer 55 (*)    All other components within normal limits  I-STAT CHEM 8, ED - Abnormal; Notable for the following components:   Calcium, Ion 1.14 (*)    Hemoglobin 11.9 (*)    HCT 35.0 (*)    All other components within normal limits  ETHANOL  PROTIME-INR  APTT  DIFFERENTIAL  RAPID URINE  DRUG SCREEN, HOSP PERFORMED  URINALYSIS, ROUTINE W REFLEX MICROSCOPIC  I-STAT TROPONIN, ED    EKG  EKG Interpretation  Date/Time:  Sunday October 11 2017 09:44:32 EST Ventricular Rate:  64 PR Interval:    QRS Duration: 106 QT Interval:  601 QTC Calculation: 621 R Axis:   -16 Text Interpretation:  Sinus rhythm Borderline left axis deviation Abnormal T, consider ischemia, diffuse leads Prolonged QT interval since 2015, no changes seen T wave abnormality persistent. Confirmed by Eber Hong ([id number]) on 10/11/2017 10:06:20 AM       Radiology Ct Head Wo Contrast  Result Date: 10/11/2017 CLINICAL DATA:  Altered mental status. Generalized weakness. Multiple recent falls. EXAM: CT HEAD WITHOUT CONTRAST TECHNIQUE: Contiguous axial images were obtained from the base of the skull through the vertex without intravenous contrast. COMPARISON:  None. FINDINGS: Brain: Diffusely enlarged ventricles and subarachnoid spaces. Patchy white matter low density in both cerebral hemispheres. No intracranial hemorrhage, mass lesion or CT evidence of acute infarction. Vascular: No hyperdense vessel or unexpected calcification. Skull: Normal. Negative for fracture or focal lesion. Sinuses/Orbits: Dysconjugate gaze with both eyes directed medially. Normally pneumatized paranasal sinuses. Other: None. IMPRESSION: 1. No acute abnormality. 2. Moderate diffuse cerebral atrophy and mild diffuse cerebellar atrophy. 3. Moderate chronic small vessel white matter ischemic changes in both cerebral hemispheres. 4. Dysconjugate gaze.  Electronically Signed   By: Beckie Salts M.D.   On: 10/11/2017 11:48    Procedures Procedures (including critical care time)  Medications Ordered in ED Medications - No data to display   Initial Impression / Assessment and Plan / ED Course  I have reviewed the triage vital signs and the nursing notes.  Pertinent labs & imaging results that were available during my care of the patient were reviewed by me and considered in my medical decision making (see chart for details).    The patient has likely had some type of event, I would consider a stroke especially given her slurred speech and the fall with now her inability to walk without assistance.  That being said the exam is frustratingly inadequate given her inability to cooperate with exam.  She does withdraw all 4 extremities to painful stimuli and is very quick to tell you to stop whenever you touch or examine her or ask her to do anything.  I do not see any facial droop or cranial nerve abnormalities, I will obtain a CT scan of the brain as well as lab work but I suspect the patient will need further workup for possible stroke.  CT viewed and interpreted Labs viewed No acute findings - Hgb is low but > 10 Has persistent findings on exam - non specific  D/w Dr. Gonzella Lex - will admit for observation - may need the MRI in the morning.  Final Clinical Impressions(s) / ED Diagnoses   Final diagnoses:  Altered mental status, unspecified altered mental status type  Slurred speech      Eber Hong, MD 10/11/17 1220

## 2017-10-12 ENCOUNTER — Observation Stay (HOSPITAL_COMMUNITY): Payer: Medicare Other

## 2017-10-12 ENCOUNTER — Observation Stay (HOSPITAL_BASED_OUTPATIENT_CLINIC_OR_DEPARTMENT_OTHER)
Admit: 2017-10-12 | Discharge: 2017-10-12 | Disposition: A | Discharge status: Home / Self Care | Payer: Medicare Other | Attending: Internal Medicine | Admitting: Internal Medicine

## 2017-10-12 ENCOUNTER — Observation Stay (HOSPITAL_BASED_OUTPATIENT_CLINIC_OR_DEPARTMENT_OTHER): Payer: Medicare Other

## 2017-10-12 DIAGNOSIS — G9340 Encephalopathy, unspecified: Secondary | ICD-10-CM | POA: Diagnosis not present

## 2017-10-12 DIAGNOSIS — I10 Essential (primary) hypertension: Secondary | ICD-10-CM | POA: Diagnosis not present

## 2017-10-12 DIAGNOSIS — D518 Other vitamin B12 deficiency anemias: Secondary | ICD-10-CM

## 2017-10-12 DIAGNOSIS — I6789 Other cerebrovascular disease: Secondary | ICD-10-CM

## 2017-10-12 LAB — LIPID PANEL
CHOLESTEROL: 180 mg/dL (ref 0–200)
HDL: 58 mg/dL (ref >40)
LDL Cholesterol: 107 mg/dL — ABNORMAL HIGH (ref 0–99)
TRIGLYCERIDES: 73 mg/dL (ref <150)
Total CHOL/HDL Ratio: 3.1 RATIO
VLDL: 15 mg/dL (ref 0–40)

## 2017-10-12 LAB — HEMOGLOBIN A1C
HEMOGLOBIN A1C: 5.3 % (ref 4.8–5.6)
MEAN PLASMA GLUCOSE: 105.41 mg/dL

## 2017-10-12 LAB — HIV ANTIBODY (ROUTINE TESTING W REFLEX): HIV Screen 4th Generation wRfx: NONREACTIVE

## 2017-10-12 LAB — RPR: RPR Ser Ql: NONREACTIVE

## 2017-10-12 LAB — ECHOCARDIOGRAM COMPLETE
Height: 67 in
Weight: 2903.02 oz

## 2017-10-12 LAB — T4, FREE: Free T4: 1.03 ng/dL (ref 0.61–1.12)

## 2017-10-12 LAB — TROPONIN I

## 2017-10-12 MED ORDER — CYANOCOBALAMIN 1000 MCG/ML IJ SOLN
1000.0000 ug | Freq: Once | INTRAMUSCULAR | Status: AC
  Administered 2017-10-12: 1000 ug via INTRAMUSCULAR
  Filled 2017-10-12: qty 1

## 2017-10-12 MED ORDER — LORAZEPAM 2 MG/ML IJ SOLN
2.0000 mg | Freq: Once | INTRAMUSCULAR | Status: AC
  Administered 2017-10-12: 2 mg via INTRAVENOUS
  Filled 2017-10-12: qty 1

## 2017-10-12 MED ORDER — LEVETIRACETAM 250 MG PO TABS
250.0000 mg | ORAL_TABLET | Freq: Two times a day (BID) | ORAL | Status: DC
  Administered 2017-10-13: 250 mg via ORAL
  Filled 2017-10-12 (×4): qty 1

## 2017-10-12 MED ORDER — LEVETIRACETAM IN NACL 500 MG/100ML IV SOLN
500.0000 mg | Freq: Once | INTRAVENOUS | Status: AC
  Administered 2017-10-12: 500 mg via INTRAVENOUS
  Filled 2017-10-12: qty 100

## 2017-10-12 NOTE — Progress Notes (Signed)
EEG complete - results pending 

## 2017-10-12 NOTE — Consult Note (Signed)
[place] A. Merlene Laughter, MD     [email or web address]          Gabrielle Brown is an 77 y.o. female.   ASSESSMENT/PLAN: 1. 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 The patient would like to be placed on Keppra. We will give the patient a mini loading dose of 500 given her age and baseline maintenance dose of 250 twice a day. She should be evaluated for physical and occupational therapy. She may need to be placed in a skilled facility for a couple weeks until she recovered.  2. Baseline dementia: Consider per medication such as Aricept and Namenda in the future. This can be done as outpatient.  The patient is an 77 year old black female who has a baseline history of dementia. History is obtained from the patient's daughter. Daughter tells me that the patient has had short-term memory impairment and get things confused for the past 4 years or so. She was diagnosed as having dementia about 4 years ago. She currently is not on medications for this. It is unclear why this is. She at baseline is able to feed herself and takes her medication. She does prepare her own breakfast. Her daughter prepares the rest of the meals. She states at home during the daytime by herself. Her daughter lives with her. She is no longer driving because of getting confused. She also is able to walk and loves to dance at baseline. The patient apparently fell about 2 days ago and since then she's been confused. The daughter found her in the middle of the night in her bedroom on the floor again. Again, she was confused afterwards and continues to be confused. The daughter noticed that she did have some shaking on the right side after one of these events. Patient is currently quite drowsy. She apparently has been up most of the night per the daughter. Review systems is very limited given the  above.  GENERAL: This is an overweight female who is sleeping. She is in no acute distress.  HEENT: This is normal. Neck is supple and no trauma appreciated.  ABDOMEN: soft  EXTREMITIES: No edema; there is marked arthritic changes especially of the knees and lower extremities.   BACK: This is normal.  SKIN: Normal by inspection.    MENTAL STATUS: She is quite stuporous and requires repeat stimulation. When awakened she does converses but conversation is nonsensical. She does not follow commands. Speech is not dysarthric.  CRANIAL NERVES: Pupils are equal, round and reactive to light and accomodation; extra ocular movements are full, there is no significant nystagmus; visual fields are full; upper and lower facial muscles are normal in strength and symmetric, there is no flattening of the nasolabial folds;   MOTOR: Bulk and tone appears to be normal in the upper lower extremities. She has antigravity strength in all 4 extremities but the exact strength is not obtainable due to lack of cooperation.  COORDINATION: No evidence of rigidity, bradykinesia or tremors. No dysmetria appreciated.  REFLEXES: Deep tendon reflexes are symmetrical and normal but clearly brisk in the legs. Plantar reflexes are flexor bilaterally.   SENSATION: Normal to pain.        Blood pressure (!) 157/88, pulse 70, temperature 98.4 F (36.9 C), temperature source Oral, resp. rate 20, height 5' 7"  (1.702 m), weight 181 lb 7 oz (82.3 kg), SpO2 99 %.  Past Medical History:  Diagnosis Date  . Dementia   . Gallstones    PT STATES SHE DOES NOT HAVE ANY APPETITE AND HAS HAD SOME N&V.  Marland Kitchen Hypertension   . Stiffness in joint    BILATERAL KNEE STIFFNESS - STATES NO PAIN BUT SHE GETS INJECTIONS IN HER KNEES TO HELP WITH STIFFNESS    Past Surgical History:  Procedure Laterality Date  . ABDOMINAL HYSTERECTOMY    . CHOLECYSTECTOMY N/A 09/27/2013   Procedure: LAPAROSCOPIC CHOLECYSTECTOMY, CHOLANGIOGRAM, REPAIR OF  UMBILICAL HERNIA;  Surgeon: Adin Hector, MD;  Location: [facility];  Service: General;  Laterality: N/A;  . HERNIA REPAIR      History reviewed. No pertinent family history.  Social History:  reports that  has never smoked. she has never used smokeless tobacco. She reports that she does not drink alcohol or use drugs.  Allergies: No Known Allergies  Medications: Prior to Admission medications   Medication Sig Start Date End Date Taking? Authorizing Provider  atenolol (TENORMIN) 25 MG tablet Take 25 mg by mouth 2 (two) times daily.  08/26/13  Yes [provider]  BENICAR HCT 20-12.5 MG per tablet Take 1 tablet by mouth every morning.  09/12/13  Yes [provider]  levothyroxine (SYNTHROID, LEVOTHROID) 50 MCG tablet Take 50 mcg by mouth daily before breakfast.   Yes [provider]  traZODone (DESYREL) 100 MG tablet Take 100 mg by mouth at bedtime.   Yes [provider]  zolpidem (AMBIEN) 10 MG tablet Take 10 mg by mouth at bedtime as needed for sleep.  08/10/13  Yes [provider]    Scheduled Meds: . aspirin  300 mg Rectal Daily   Or  . aspirin  325 mg Oral Daily  . atorvastatin  80 mg Oral q1800  . enoxaparin (LOVENOX) injection  40 mg Subcutaneous Q24H  . levothyroxine  25 mcg Intravenous Daily   Continuous Infusions: . sodium chloride 75 mL/hr at 10/12/17 0509   PRN Meds:.acetaminophen **OR** acetaminophen (TYLENOL) oral liquid 160 mg/5 mL **OR** acetaminophen, LORazepam, senna-docusate     Results for orders placed or performed during the hospital encounter of 10/11/17 (from the past 48 hour(s))  Ethanol     Status: None   Collection Time: 10/11/17  9:36 AM  Result Value Ref Range   Alcohol, Ethyl (B) <10 <10 mg/dL    Comment:        LOWEST DETECTABLE LIMIT FOR SERUM ALCOHOL IS 10 mg/dL FOR MEDICAL PURPOSES ONLY Performed at [facility], [address], [place], [place] [address]   Protime-INR     Status: None    Collection Time: 10/11/17  9:36 AM  Result Value Ref Range   Prothrombin Time 13.1 11.4 - 15.2 seconds   INR 1.00     Comment: Performed at [facility], [address], [place], [place] [address]  APTT     Status: None   Collection Time: 10/11/17  9:36 AM  Result Value Ref Range   aPTT 26 24 - 36 seconds    Comment: Performed at [facility], [address], [place], [place] [address]  CBC     Status: Abnormal   Collection Time: 10/11/17  9:36 AM  Result Value Ref Range   WBC 6.6 4.0 - 10.5 K/uL   RBC 4.63 3.87 - 5.11 MIL/uL   Hemoglobin 10.4 (L) 12.0 - 15.0 g/dL   HCT 33.4 (L) 36.0 - 46.0 %   MCV 72.1 (L) 78.0 - 100.0 fL   MCH 22.5 (  L) 26.0 - 34.0 pg   MCHC 31.1 30.0 - 36.0 g/dL   RDW 14.9 11.5 - 15.5 %   Platelets 166 150 - 400 K/uL    Comment: Performed at [facility], [address], [place], [place] [address]  Differential     Status: None   Collection Time: 10/11/17  9:36 AM  Result Value Ref Range   Neutrophils Relative % 61 %   Neutro Abs 4.0 1.7 - 7.7 K/uL   Lymphocytes Relative 26 %   Lymphs Abs 1.7 0.7 - 4.0 K/uL   Monocytes Relative 10 %   Monocytes Absolute 0.7 0.1 - 1.0 K/uL   Eosinophils Relative 2 %   Eosinophils Absolute 0.1 0.0 - 0.7 K/uL   Basophils Relative 1 %   Basophils Absolute 0.0 0.0 - 0.1 K/uL    Comment: Performed at [facility], [address], [place], [place] [address]  Comprehensive metabolic panel     Status: Abnormal   Collection Time: 10/11/17  9:36 AM  Result Value Ref Range   Sodium 138 135 - 145 mmol/L   Potassium 4.1 3.5 - 5.1 mmol/L   Chloride 102 101 - 111 mmol/L   CO2 26 22 - 32 mmol/L   Glucose, Bld 98 65 - 99 mg/dL   BUN 15 6 - 20 mg/dL   Creatinine, Ser 0.97 0.44 - 1.00 mg/dL   Calcium 9.3 8.9 - 10.3 mg/dL   Total Protein 6.8 6.5 - 8.1 g/dL   Albumin 3.6 3.5 - 5.0 g/dL   AST 26 15 - 41 U/L   ALT 15 14 - 54 U/L   Alkaline Phosphatase 63 38 - 126 U/L   Total Bilirubin 0.7 0.3 - 1.2 mg/dL   GFR calc non Af Amer 55 (L) >60  mL/min   GFR calc Af Amer >60 >60 mL/min    Comment: (NOTE) The eGFR has been calculated using the CKD EPI equation. This calculation has not been validated in all clinical situations. eGFR's persistently <60 mL/min signify possible Chronic Kidney Disease.    Anion gap 10 5 - 15    Comment: Performed at [facility], [address], [place], [place] [address]  I-stat troponin, ED     Status: None   Collection Time: 10/11/17  9:55 AM  Result Value Ref Range   Troponin i, poc 0.00 0.00 - 0.08 ng/mL   Comment 3            Comment: Due to the release kinetics of cTnI, a negative result within the first hours of the onset of symptoms does not rule out myocardial infarction with certainty. If myocardial infarction is still suspected, repeat the test at appropriate intervals.   I-Stat Chem 8, ED     Status: Abnormal   Collection Time: 10/11/17  9:58 AM  Result Value Ref Range   Sodium 139 135 - 145 mmol/L   Potassium 4.3 3.5 - 5.1 mmol/L   Chloride 103 101 - 111 mmol/L   BUN 14 6 - 20 mg/dL   Creatinine, Ser 1.00 0.44 - 1.00 mg/dL   Glucose, Bld 93 65 - 99 mg/dL   Calcium, Ion 1.14 (L) 1.15 - 1.40 mmol/L   TCO2 25 22 - 32 mmol/L   Hemoglobin 11.9 (L) 12.0 - 15.0 g/dL   HCT 35.0 (L) 36.0 - 46.0 %  Urine rapid drug screen (hosp performed)     Status: None   Collection Time: 10/11/17 10:00 AM  Result Value Ref Range  Opiates NONE DETECTED NONE DETECTED   Cocaine NONE DETECTED NONE DETECTED   Benzodiazepines NONE DETECTED NONE DETECTED   Amphetamines NONE DETECTED NONE DETECTED   Tetrahydrocannabinol NONE DETECTED NONE DETECTED   Barbiturates NONE DETECTED NONE DETECTED    Comment: (NOTE) DRUG SCREEN FOR MEDICAL PURPOSES ONLY.  IF CONFIRMATION IS NEEDED FOR ANY PURPOSE, NOTIFY LAB WITHIN 5 DAYS. LOWEST DETECTABLE LIMITS FOR URINE DRUG SCREEN Drug Class                     Cutoff (ng/mL) Amphetamine and metabolites    1000 Barbiturate and metabolites    200 Benzodiazepine                  160 Tricyclics and metabolites     300 Opiates and metabolites        300 Cocaine and metabolites        300 THC                            50 Performed at [facility] [place], [place] [address]   Urinalysis, Routine w reflex microscopic     Status: None   Collection Time: 10/11/17 10:00 AM  Result Value Ref Range   Color, Urine YELLOW YELLOW   APPearance CLEAR CLEAR   Specific Gravity, Urine 1.012 1.005 - 1.030   pH 5.0 5.0 - 8.0   Glucose, UA NEGATIVE NEGATIVE mg/dL   Hgb urine dipstick NEGATIVE NEGATIVE   Bilirubin Urine NEGATIVE NEGATIVE   Ketones, ur NEGATIVE NEGATIVE mg/dL   Protein, ur NEGATIVE NEGATIVE mg/dL   Nitrite NEGATIVE NEGATIVE   Leukocytes, UA NEGATIVE NEGATIVE    Comment: Performed at [facility], [address] [place], [place] [address]  Vitamin B12     Status: Abnormal   Collection Time: 10/11/17  2:00 PM  Result Value Ref Range   Vitamin B-12 134 (L) 180 - 914 pg/mL    Comment: (NOTE) This assay is not validated for testing neonatal or myeloproliferative syndrome specimens for Vitamin B12 levels. Performed at [facility], [place] [address] [place], [place] [address]   Ammonia     Status: None   Collection Time: 10/11/17  2:00 PM  Result Value Ref Range   Ammonia 11 9 - 35 umol/L    Comment: Performed at [facility], [address], [place], [place] [address]  TSH     Status: Abnormal   Collection Time: 10/11/17  2:00 PM  Result Value Ref Range   TSH 6.208 (H) 0.350 - 4.500 uIU/mL    Comment: Performed by a 3rd Generation assay with a functional sensitivity of <=0.01 uIU/mL. Performed at [facility], [address], [place], [place] [address]   RPR     Status: None   Collection Time: 10/11/17  2:00 PM  Result Value Ref Range   RPR Ser Ql Non Reactive Non Reactive    Comment: (NOTE) Performed At: [facility] [place], [place] [id number] Rush Farmer MD [id number] Performed at [facility], [address] [place], [place] [address]   HIV antibody     Status: None   Collection Time: 10/11/17  2:00 PM  Result Value Ref Range   HIV Screen 4th Generation wRfx Non Reactive Non Reactive    Comment: (NOTE) Performed At: [facility] [place], [place] [id number] Rush Farmer MD [id number] Performed at [facility], [address] [place], [place] [address]   Magnesium     Status: None   Collection Time: 10/11/17  2:00 PM  Result Value Ref Range   Magnesium 1.8 1.7 - 2.4 mg/dL    Comment: Performed at [facility], [address] [place], [place] [address]  Troponin I (q 6hr x 3)     Status: None   Collection Time: 10/11/17  2:00 PM  Result Value Ref Range   Troponin I <0.03 <0.03 ng/mL    Comment: Performed at [facility], [address], [place], [place] [address]  Troponin I (q 6hr x 3)     Status: None   Collection Time: 10/11/17  7:49 PM  Result Value Ref Range   Troponin I <0.03 <0.03 ng/mL    Comment: Performed at [facility], [address], [place], [place] [address]  Troponin I (q 6hr x 3)     Status: None   Collection Time: 10/12/17  2:57 AM  Result Value Ref Range   Troponin I <0.03 <0.03 ng/mL    Comment: Performed at [facility], [address] [place], [place] [address]  Hemoglobin A1c     Status: None   Collection Time: 10/12/17  2:57 AM  Result Value Ref Range   Hgb A1c MFr Bld 5.3 4.8 - 5.6 %    Comment: (NOTE) Pre diabetes:          5.7%-6.4% Diabetes:              >6.4% Glycemic control for   <7.0% adults with diabetes    Mean Plasma Glucose 105.41 mg/dL    Comment: Performed at [facility] [address] [place], [place] [address]  Lipid panel     Status: Abnormal   Collection Time: 10/12/17  2:57 AM  Result Value Ref Range   Cholesterol 180 0 - 200 mg/dL   Triglycerides 73 <150 mg/dL   HDL 58 >40 mg/dL   Total CHOL/HDL Ratio 3.1 RATIO   VLDL 15 0 - 40 mg/dL   LDL Cholesterol 107 (H) 0 - 99  mg/dL    Comment:        Total Cholesterol/HDL:CHD Risk Coronary Heart Disease Risk Table                     Men   Women  1/2 Average Risk   3.4   3.3  Average Risk       5.0   4.4  2 X Average Risk   9.6   7.1  3 X Average Risk  23.4   11.0        Use the calculated Patient Ratio above and the CHD Risk Table to determine the patient's CHD Risk.        ATP III CLASSIFICATION (LDL):  <100     mg/dL   Optimal  100-129  mg/dL   Near or Above                    Optimal  130-159  mg/dL   Borderline  160-189  mg/dL   High  >190     mg/dL   Very High Performed at [facility] [place],  [address]     Studies/Results:  HEAD  MRA MRI FINDINGS: MRI HEAD FINDINGS  Severely motion degraded and partial study with no coronal T2, FLAIR, or axial T1 weighted imaging.  Severe brain atrophy, with sulcal and temporal horn  widening greater on the left. Reasonably diagnostic diffusion that is negative for infarct. No hydrocephalus, shift, or evidence of extra-axial collection.  MRA HEAD FINDINGS  Severely motion degraded to the degree that only patency of bilateral carotid, vertebral, basilar arteries is possible. Apparent multifocal high-grade stenosis is not a reliable finding given the degree of artifact.  IMPRESSION: 1. Partial and severely motion degraded exam. MRA was nondiagnostic. 2. Diffusion imaging is diagnostic and negative for infarct. 3. Severe atrophy in this patient with dementia. There is asymmetric volume loss on the left as seen with semantic dementia   CAROTID DOPPLERS IMPRESSION: Color duplex shows no significant plaque, with no hemodynamically significant stenosis by duplex criteria in the extracranial cerebrovascular circulation.    ECHO - Left ventricle: The cavity size was normal. Wall thickness was   normal. Systolic function was normal. The estimated ejection   fraction was in the range of 55% to 60%. Wall motion was  normal;   there were no regional wall motion abnormalities. Doppler   parameters are consistent with abnormal left ventricular   relaxation (grade 1 diastolic dysfunction). - Atrial septum: No defect or patent foramen ovale was identified.       THE BRAIN MRI SCAN IS REVIEWED IN PERSON.  There is marked global atrophy with the left temporal region be more prominent than the contralateral side.  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  Nothing acute is seen.  MRA is significantly degraded by motion artifact.  Nothing of concern although there may be some questionable distal M1 stenosis bilaterally.        Byanca Kasper A. Merlene Laughter, M.D.  Diplomate, Tax adviser of Psychiatry and Neurology ( Neurology). 10/12/2017, 6:04 PM

## 2017-10-12 NOTE — Evaluation (Signed)
Physical Therapy Screening  Patient Details Name: Gabrielle Brown MRN: [id number] DOB: June 04, 1941 Today's Date: 10/12/2017   History of Present Illness  Gabrielle Brown is a 77yo black female who comes to [facility] from [place], [place] after 3 days of falls, weakness, dysarthria at home. CVA work-up so far all negative, with ongoing investigation of HyperTSH, hypoB12. Pt has mild dementia, as described by daughter with intermittent difficulty with word substitution, recognizing family, and occasional disorientation. Pt is largely homebound due to low motivation, but has never required A/E for AMB, although she does intermittently use furnitre for stability. No prior falls history. PTA pt is fully independet with ADL.   Clinical Impression  Per medical record, pt returned to baseline level of function in mobility as per OT and pt's son. Stopped in room for PT eval/screening. Unable to awake patient. Daughter also unable to awake patient. Extensive education on best setting for PT upon return to home, how to integrate use of AD with dementia population, and need for 24/7 supervision. Daughter expresses a strong interest in keeping patient out of a facility for PT needs. Pt is a good candidate for OPPT services. Patient is at baseline, all education completed, and time is given to address all questions/concerns. No additional skilled PT services needed at this time, PT signing off. PT recommends daily ambulation ad lib or with nursing staff as needed to prevent deconditioning.       Follow Up Recommendations Outpatient PT(discussed at length with daughter, encouraged her to obtain Rx for PT from PCP)    Equipment Recommendations  None recommended by PT    Recommendations for Other Services       Precautions / Restrictions Precautions Precautions: Fall Restrictions Weight Bearing Restrictions: No      Mobility  Bed Mobility                  Transfers                     Ambulation/Gait                Stairs            Wheelchair Mobility    Modified Rankin (Stroke Patients Only)       Balance                                             Pertinent Vitals/Pain Pain Assessment: Faces Faces Pain Scale: No hurt    Home Living Family/patient expects to be discharged to:: Private residence Living Arrangements: Children(daughter, works nights, son stays overnight) Available Help at Discharge: Family Type of Home: House Home Access: Level entry     Home Layout: One level Home Equipment: None      Prior Function Level of Independence: Independent         Comments: PTA no use of DME, no falls history, indep in ADL, still makes her own breakfast, still dances in the house per daughter.      Hand Dominance        Extremity/Trunk Assessment                Communication   Communication: (pt remains alseep during visit. )  Cognition Arousal/Alertness: Lethargic(asleep, unable to wake patient )   Overall Cognitive Status: History of cognitive impairments - at baseline  General Comments      Exercises     Assessment/Plan    PT Assessment All further PT needs can be met in the next venue of care;Patent does not need any further PT services  PT Problem List         PT Treatment Interventions      PT Goals (Current goals can be found in the Care Plan section)  Acute Rehab PT Goals PT Goal Formulation: All assessment and education complete, DC therapy    Frequency     Barriers to discharge        Co-evaluation               AM-PAC PT "6 Clicks" Daily Activity  Outcome Measure                  End of Session   Activity Tolerance: Patient limited by lethargy Patient left: in bed;with call bell/phone within reach;with bed alarm set;with family/visitor present Nurse Communication: Other (comment)(unable to wake  patient ) PT Visit Diagnosis: History of falling (Z91.81)    Time: 5027-7412 PT Time Calculation (min) (ACUTE ONLY): 22 min   Charges:         PT G Codes:        4:59 PM, 10/28/17 Etta Grandchild, PT, DPT Physical Therapist - [place] [phone] (Office)   Buccola,Allan C Oct 28, 2017, 4:56 PM

## 2017-10-12 NOTE — Progress Notes (Signed)
PROGRESS NOTE                                                                                                                                                                                                             Patient Demographics:    Gabrielle PinaHelen Brown, is a 77 y.o. female, DOB - Jun 25, 1941, RUE:[id number]  Admit date - 10/11/2017   Admitting Physician Eddie NorthNishant Calysta Craigo, MD  Outpatient Primary MD for the patient is Arlina RobesWinfield, Albert Carl, MD  LOS - 0  Outpatient Specialists: None   Chief Complaint  Patient presents with  . Weakness       Brief Narrative  77 year old female with hypertension, hypothyroidism and mild dementia brought to the ED with frequent falls for last few days along with confusion and impaired speech.  Head CT negative for acute findings.  Patient placed on observation for stroke rule out.   Subjective:   Patient very confused and talking incoherently.   restless this morning.  Failed swallow evaluation.   Assessment  & Plan :    Principal Problem:   Acute encephalopathy Concern for stroke.  Also has significantly low B12 (134) and elevated TSH 6.02.  Check free T4 and T3.  Add B12 injections. Continue aspirin suppository.  Official swallow eval today.  MRI brain/MRA head, 2D echo and carotid Doppler pending.  LDL of 107.  Added statin.  A1c pending. Follow results and neurology evaluation.  PT/OT evaluation. EEG to rule out stroke.  Active Problems:    Essential hypertension Allow permissive hypertension.        Code Status : Full code  Family Communication  : Son at bedside  Disposition Plan  : Pending workup, neurology and PT evaluation.  Barriers For Discharge : Active symptoms  Consults  : Neurology  Procedures  : CT head  DVT Prophylaxis  :  Lovenox -   Lab Results  Component Value Date   PLT 166 10/11/2017    Antibiotics  :   Anti-infectives (From admission,  onward)   None        Objective:   Vitals:   10/12/17 0230 10/12/17 0433 10/12/17 0633 10/12/17 1103  BP: 132/75 125/62 130/60 (!) 158/89  Pulse: 70 66 65 66  Resp: 18 18 20 18   Temp: 98.5 F (36.9 C) 98.4 F (36.9 C) 98.7 F (37.1  C) 98.2 F (36.8 C)  TempSrc:  Oral Oral Axillary  SpO2: 100% 98% 99% 99%  Weight:      Height:        Wt Readings from Last 3 Encounters:  10/11/17 82.3 kg (181 lb 7 oz)  10/10/13 95.1 kg (209 lb 9.6 oz)  09/27/13 94 kg (207 lb 3.7 oz)     Intake/Output Summary (Last 24 hours) at 10/12/2017 1128 Last data filed at 10/12/2017 0600 Gross per 24 hour  Intake 945 ml  Output 1100 ml  Net -155 ml     Physical Exam  Gen: not in distress, restless, speech better today. HEENT: no pallor, moist mucosa, supple neck Chest: clear b/l, no added sounds CVS: N S1&S2, no murmurs,  GI: soft, NT, ND, BS+ Musculoskeletal: warm, no edema CNS: AAOX1, able to recognize her son   Data Review:    CBC Recent Labs  Lab 10/11/17 0936 10/11/17 0958  WBC 6.6  --   HGB 10.4* 11.9*  HCT 33.4* 35.0*  PLT 166  --   MCV 72.1*  --   MCH 22.5*  --   MCHC 31.1  --   RDW 14.9  --   LYMPHSABS 1.7  --   MONOABS 0.7  --   EOSABS 0.1  --   BASOSABS 0.0  --     Chemistries  Recent Labs  Lab 10/11/17 0936 10/11/17 0958 10/11/17 1400  NA 138 139  --   K 4.1 4.3  --   CL 102 103  --   CO2 26  --   --   GLUCOSE 98 93  --   BUN 15 14  --   CREATININE 0.97 1.00  --   CALCIUM 9.3  --   --   MG  --   --  1.8  AST 26  --   --   ALT 15  --   --   ALKPHOS 63  --   --   BILITOT 0.7  --   --    ------------------------------------------------------------------------------------------------------------------ Recent Labs    10/12/17 0257  CHOL 180  HDL 58  LDLCALC 107*  TRIG 73  CHOLHDL 3.1    No results found for: HGBA1C ------------------------------------------------------------------------------------------------------------------ Recent Labs      10/11/17 1400  TSH 6.208*   ------------------------------------------------------------------------------------------------------------------ Recent Labs    10/11/17 1400  VITAMINB12 134*    Coagulation profile Recent Labs  Lab 10/11/17 0936  INR 1.00    No results for input(s): DDIMER in the last 72 hours.  Cardiac Enzymes Recent Labs  Lab 10/11/17 1400 10/11/17 1949 10/12/17 0257  TROPONINI <0.03 <0.03 <0.03   ------------------------------------------------------------------------------------------------------------------ No results found for: BNP  Inpatient Medications  Scheduled Meds: . aspirin  300 mg Rectal Daily   Or  . aspirin  325 mg Oral Daily  . atorvastatin  80 mg Oral q1800  . enoxaparin (LOVENOX) injection  40 mg Subcutaneous Q24H  . levothyroxine  25 mcg Intravenous Daily   Continuous Infusions: . sodium chloride 75 mL/hr at 10/12/17 0509   PRN Meds:.acetaminophen **OR** acetaminophen (TYLENOL) oral liquid 160 mg/5 mL **OR** acetaminophen, LORazepam, senna-docusate  Micro Results No results found for this or any previous visit (from the past 240 hour(s)).  Radiology Reports Ct Head Wo Contrast  Result Date: 10/11/2017 CLINICAL DATA:  Altered mental status. Generalized weakness. Multiple recent falls. EXAM: CT HEAD WITHOUT CONTRAST TECHNIQUE: Contiguous axial images were obtained from the base of the skull through the vertex without  intravenous contrast. COMPARISON:  None. FINDINGS: Brain: Diffusely enlarged ventricles and subarachnoid spaces. Patchy white matter low density in both cerebral hemispheres. No intracranial hemorrhage, mass lesion or CT evidence of acute infarction. Vascular: No hyperdense vessel or unexpected calcification. Skull: Normal. Negative for fracture or focal lesion. Sinuses/Orbits: Dysconjugate gaze with both eyes directed medially. Normally pneumatized paranasal sinuses. Other: None. IMPRESSION: 1. No acute abnormality.  2. Moderate diffuse cerebral atrophy and mild diffuse cerebellar atrophy. 3. Moderate chronic small vessel white matter ischemic changes in both cerebral hemispheres. 4. Dysconjugate gaze. Electronically Signed   By: Beckie Salts M.D.   On: 10/11/2017 11:48    Time Spent in minutes  25   Yarden Hillis M.D on 10/12/2017 at 11:28 AM  Between 7am to 7pm - Pager - [phone]  After 7pm go to [email or web address] - password [facility]  [facility] -  Office  [phone]

## 2017-10-12 NOTE — Procedures (Signed)
ELECTROENCEPHALOGRAM REPORT  Date of Study: 10/12/2017  Patient's Name: Gabrielle Brown MRN: [id number] Date of Birth: Dec 07, 1940  Referring Provider: Dr. Theda BelfastNishant Dhungel  Clinical History: This is a 77 year old woman with altered mental status.  Medications: Ativan given 8 hours prior to EEG  Technical Summary: A multichannel digital EEG recording measured by the international 10-20 system with electrodes applied with paste and impedances below 5000 ohms performed in our laboratory with EKG monitoring in an awake and drowsy patient.  Hyperventilation and photic stimulation were not performed.  The digital EEG was referentially recorded, reformatted, and digitally filtered in a variety of bipolar and referential montages for optimal display.    Description: The patient is awake and drowsy during the recording.  During maximal wakefulness, there is a poorly sustained low voltage 10-11 Hz posterior dominant rhythm that attenuates with eye opening.  The record is symmetric with diffuse low voltage activity seen.  During drowsiness, there is an increase in theta and delta slowing of the background. Deeper stages of sleep were not seen. Hyperventilation and photic stimulation were not performed.  There were no epileptiform discharges or electrographic seizures seen.    EKG lead was unremarkable.  Impression: This awake and drowsy EEG is mildly abnormal due to diffuse low voltage activity throughout the recording.  Clinical Correlation: Diffuse low voltage activity is nonspecific in etiology, it may be a normal variant (seen with advancing age, nervousness, anxiety) in the absence of extracerebral pathology (scalp edema, subdural hematoma). It can also be seen with degenerative or metabolic conditions.    Patrcia DollyKaren Adryanna Friedt, M.D.

## 2017-10-12 NOTE — Progress Notes (Signed)
OT Screen Note  Patient Details Name: Gabrielle PinaHelen Brown MRN: [id number] DOB: 1941-01-30   Cancelled Treatment:    Reason Eval/Treat Not Completed: OT screened, no needs identified, will sign off.  Patient in bed upon therapy arrival. Patient's son present and able to provide baseline information this AM. Patient's speech has greatly improved since initially arriving to hospital. Patient is using her BUE normally with functional strength. She is currently asking to eat although is NPO at this time. Patient currently lives with daughter who works full time at night. Son lives down the road and will stay with patient at night. Patient is able to complete all ADL tasks and ambulate without an assistive device. At this time, patient does not require any additional OT services. Thank-you for the referral.    Limmie PatriciaLaura Svara Twyman, OTR/L,CBIS  [phone]  10/12/2017, 8:51 AM

## 2017-10-12 NOTE — Progress Notes (Signed)
Echocardiogram 2D Echocardiogram has been performed.  Gabrielle PartridgeBrooke Brown Gabrielle Brown 10/12/2017, 10:49 AM

## 2017-10-12 NOTE — Evaluation (Signed)
Clinical/Bedside Swallow Evaluation Patient Details  Name: Gabrielle Brown MRN: [id number] Date of Birth: 1941/06/07  Today's Date: 10/12/2017 Time: SLP Start Time (ACUTE ONLY): 1417 SLP Stop Time (ACUTE ONLY): 1432 SLP Time Calculation (min) (ACUTE ONLY): 15 min  Past Medical History:  Past Medical History:  Diagnosis Date  . Dementia   . Gallstones    PT STATES SHE DOES NOT HAVE ANY APPETITE AND HAS HAD SOME N&V.  Marland Kitchen Hypertension   . Stiffness in joint    BILATERAL KNEE STIFFNESS - STATES NO PAIN BUT SHE GETS INJECTIONS IN HER KNEES TO HELP WITH STIFFNESS   Past Surgical History:  Past Surgical History:  Procedure Laterality Date  . ABDOMINAL HYSTERECTOMY    . CHOLECYSTECTOMY N/A 09/27/2013   Procedure: LAPAROSCOPIC CHOLECYSTECTOMY, CHOLANGIOGRAM, REPAIR OF UMBILICAL HERNIA;  Surgeon: Ernestene Mention, MD;  Location: [facility];  Service: General;  Laterality: N/A;  . HERNIA REPAIR     HPI:  77 y.o. female, with history of hypertension, hypothyroidism and mild dementia who lives with her daughter was brought to the ED for recurrent falls in the past 3 days.  On 1/31 patient was found on the floor by her daughter after she returned from work she was complaining of pain in her back and daughter also noted some facial droop so she took her to [facility] where patient had a head CT along with blood work done which was negative and was discharged home.  Daughter found that since then patient was weak with impaired speech with confusion. Again last night she had a fall in her room which was unwitnessed.  Daughter went to see her and she was alert and awake but very confused and talking incoherently, unable to recognize her.  She again noticed some facial droop but no other change.  Patient has chronic medial conjugate gaze.  Daughter denies any recent c/o headaches, blurred vision, dizziness, nausea, vomiting, chest pain, palpitations, fevers, chills, abdominal pain, dysuria, diarrhea.  Daughter  denied noticing any seizure-like activity, bowel or urinary incontinence. Pt in bed with restraints while daughter was at bedside during BSE; SLE unable to be fully completed d/t pt's refusal; will re-attempt at a later date. MRI completed 10/12/17 indicating Partial and severely motion degraded exam. MRA was nondiagnostic. 2. Diffusion imaging is diagnostic and negative for infarct. 3. Severe atrophy in this patient with dementia. There is asymmetric volume loss on the left as seen with semantic dementia  Assessment / Plan / Recommendation Clinical Impression   Pt without overt s/s of aspiration with any consistencies assessed during BSE; oral dysphagia (primarily oral holding) noted with all consistencies prior to swallowing with moderate encouragement from SLP/daughter to swallow various boluses; pt speaking during intake which placed her at a greater risk for aspiration, but despite this event paired with cognitive deficits, pt did not exhibit any overt s/s of aspiration; SLE unable to be completed d/t pt refusal with various tasks (I.e.: "open your mouth", "stick out your tongue", etc.); difficult to assess language/cognition fully, but pt was attempting to get out of bed and was restrained when SLP arrived, so overall decreased awareness and safety noted. Recommend Regular/thin liquids for diet and ST will continue to f/u while in acute setting for diet tolerance and completion of SLE. SLP Visit Diagnosis: Dysphagia, unspecified (R13.10)    Aspiration Risk  Mild aspiration risk    Diet Recommendation   Regular/thin liquids  Medication Administration: Whole meds with puree    Other  Recommendations Oral  Care Recommendations: Oral care BID   Follow up Recommendations 24 hour supervision/assistance      Frequency and Duration min 2x/week  1 week       Prognosis Prognosis for Safe Diet Advancement: Good Barriers to Reach Goals: Cognitive deficits      Swallow Study   General Date of  Onset: 10/11/17 HPI: 77 y.o. female, with history of hypertension, hypothyroidism and mild dementia who lives with her daughter was brought to the ED for recurrent falls in the past 3 days.  On 1/31 patient was found on the floor by her daughter after she returned from work she was complaining of pain in her back and daughter also noted some facial droop so she [facility] where patient had a head CT along with blood work done which was negative and was discharged home.  Daughter found that since then patient was weak with impaired speech with confusion. Again last night she had a fall in her room which was unwitnessed.  Daughter went to see her and she was alert and awake but very confused and talking incoherently, unable to recognize her.  She again noticed some facial droop but no other change.  Patient has chronic medial conjugate gaze.  Daughter denies any recent plane of headaches, blurred vision, dizziness, nausea, vomiting, chest pain, palpitations, fevers, chills, abdominal pain, dysuria, diarrhea.  Daughter denied noticing any seizure-like activity, bowel or urinary incontinence. Type of Study: Bedside Swallow Evaluation Previous Swallow Assessment: 10/11/17; failed NSSS Diet Prior to this Study: NPO Temperature Spikes Noted: No Respiratory Status: Room air History of Recent Intubation: No Behavior/Cognition: Alert;Confused;Impulsive Oral Cavity Assessment: Other (comment)(DTA d/t refusal) Oral Care Completed by SLP: Other (Comment)(Unable d/t pt refusal) Oral Cavity - Dentition: Adequate natural dentition Self-Feeding Abilities: Able to feed self;Needs assist;Other (Comment)(Pt currently with restraints when SLP arrived) Patient Positioning: Upright in bed Baseline Vocal Quality: Normal Volitional Cough: Cognitively unable to elicit Volitional Swallow: (Pt refused)    Oral/Motor/Sensory Function Overall Oral Motor/Sensory Function: Other (comment)(Appeared grossly WFL;  refused some tasks)   Ice Chips Ice chips: Not tested   Thin Liquid Thin Liquid: Impaired Presentation: Straw Oral Phase Functional Implications: Oral holding Other Comments: (Refused liquids except via straw)    Nectar Thick Nectar Thick Liquid: Not tested   Honey Thick Honey Thick Liquid: Not tested   Puree Puree: Impaired Presentation: Spoon Oral Phase Functional Implications: Oral holding   Solid      Solid: Impaired Presentation: Spoon Oral Phase Functional Implications: Oral holding        Tressie StalkerPat Khaled Herda, M.S., CCC-SLP 10/12/2017,2:42 PM

## 2017-10-12 NOTE — Care Management Obs Status (Signed)
MEDICARE OBSERVATION STATUS NOTIFICATION   Patient Details  Name: Gabrielle PinaHelen Brown MRN: [id number] Date of Birth: [birth year]   Medicare Observation Status Notification Given:  Yes    Malcolm MetroChildress, Darienne Belleau Demske, RN 10/12/2017, 8:25 AM

## 2017-10-12 NOTE — Progress Notes (Signed)
Patient failed swallow screen.  Will keep npo until seen by speech.

## 2017-10-13 DIAGNOSIS — R946 Abnormal results of thyroid function studies: Secondary | ICD-10-CM | POA: Diagnosis present

## 2017-10-13 DIAGNOSIS — D519 Vitamin B12 deficiency anemia, unspecified: Secondary | ICD-10-CM | POA: Diagnosis present

## 2017-10-13 DIAGNOSIS — E039 Hypothyroidism, unspecified: Secondary | ICD-10-CM | POA: Diagnosis present

## 2017-10-13 DIAGNOSIS — Z79890 Hormone replacement therapy (postmenopausal): Secondary | ICD-10-CM | POA: Diagnosis not present

## 2017-10-13 DIAGNOSIS — W19XXXA Unspecified fall, initial encounter: Secondary | ICD-10-CM | POA: Diagnosis present

## 2017-10-13 DIAGNOSIS — R296 Repeated falls: Secondary | ICD-10-CM | POA: Diagnosis present

## 2017-10-13 DIAGNOSIS — G9340 Encephalopathy, unspecified: Secondary | ICD-10-CM | POA: Diagnosis not present

## 2017-10-13 DIAGNOSIS — E785 Hyperlipidemia, unspecified: Secondary | ICD-10-CM | POA: Diagnosis present

## 2017-10-13 DIAGNOSIS — Y92009 Unspecified place in unspecified non-institutional (private) residence as the place of occurrence of the external cause: Secondary | ICD-10-CM | POA: Diagnosis not present

## 2017-10-13 DIAGNOSIS — R29810 Facial weakness: Secondary | ICD-10-CM | POA: Diagnosis present

## 2017-10-13 DIAGNOSIS — G9341 Metabolic encephalopathy: Secondary | ICD-10-CM | POA: Diagnosis present

## 2017-10-13 DIAGNOSIS — E663 Overweight: Secondary | ICD-10-CM | POA: Diagnosis present

## 2017-10-13 DIAGNOSIS — R569 Unspecified convulsions: Secondary | ICD-10-CM

## 2017-10-13 DIAGNOSIS — Z6832 Body mass index (BMI) 32.0-32.9, adult: Secondary | ICD-10-CM | POA: Diagnosis not present

## 2017-10-13 DIAGNOSIS — I10 Essential (primary) hypertension: Secondary | ICD-10-CM | POA: Diagnosis not present

## 2017-10-13 DIAGNOSIS — R531 Weakness: Secondary | ICD-10-CM | POA: Diagnosis present

## 2017-10-13 DIAGNOSIS — Z90710 Acquired absence of both cervix and uterus: Secondary | ICD-10-CM | POA: Diagnosis not present

## 2017-10-13 DIAGNOSIS — F0390 Unspecified dementia without behavioral disturbance: Secondary | ICD-10-CM | POA: Diagnosis present

## 2017-10-13 DIAGNOSIS — Z79899 Other long term (current) drug therapy: Secondary | ICD-10-CM | POA: Diagnosis not present

## 2017-10-13 DIAGNOSIS — R4182 Altered mental status, unspecified: Secondary | ICD-10-CM | POA: Diagnosis not present

## 2017-10-13 DIAGNOSIS — I4581 Long QT syndrome: Secondary | ICD-10-CM | POA: Diagnosis present

## 2017-10-13 LAB — T3, FREE: T3, Free: 2.5 pg/mL (ref 2.0–4.4)

## 2017-10-13 MED ORDER — OLMESARTAN MEDOXOMIL-HCTZ 20-12.5 MG PO TABS: 1.0000 | ORAL_TABLET | Freq: Every morning | ORAL | Status: DC

## 2017-10-13 MED ORDER — IRBESARTAN 150 MG PO TABS
150.0000 mg | ORAL_TABLET | Freq: Every day | ORAL | Status: DC
  Filled 2017-10-13 (×2): qty 1

## 2017-10-13 MED ORDER — HYDROCHLOROTHIAZIDE 12.5 MG PO CAPS
12.5000 mg | ORAL_CAPSULE | Freq: Every day | ORAL | Status: DC
  Filled 2017-10-13 (×2): qty 1

## 2017-10-13 MED ORDER — LEVOTHYROXINE SODIUM 75 MCG PO TABS
75.0000 ug | ORAL_TABLET | ORAL | Status: DC
  Administered 2017-10-14: 75 ug via ORAL
  Filled 2017-10-13: qty 1

## 2017-10-13 NOTE — Care Management Note (Signed)
Case Management Note  Patient Details  Name: Gabrielle PinaHelen Brown MRN: [id number] Date of Birth: Aug 29, 1941  Subjective/Objective:  Adm with acute encephalopathy. From home with family. Daughter during the day, son at night. We discuss OP vs home health. Daughter reports MD told her that patient will need a few weeks of rehab. She states she does have a Medicaid application pending and would like to discuss options with CSW . CSW notified.                   Action/Plan: CM following.   Expected Discharge Date:      10/15/2017            Expected Discharge Plan:     In-House Referral:  Clinical Social Work  Discharge planning Services  CM Consult  Post Acute Care Choice:    Choice offered to:     DME Arranged:    DME Agency:     HH Arranged:    HH Agency:     Status of Service:  In process, will continue to follow  If discussed at Long Length of Stay Meetings, dates discussed:    Additional Comments:  Shasha Buchbinder, Chrystine OilerSharley Diane, RN 10/13/2017, 12:18 PM

## 2017-10-13 NOTE — Progress Notes (Signed)
PROGRESS NOTE                                                                                                                                                                                                             Patient Demographics:    Gabrielle Brown, is a 77 y.o. female, DOB - 21-Mar-1941, ZOX:[id number]  Admit date - 10/11/2017   Admitting Physician Eddie North, MD  Outpatient Primary MD for the patient is Arlina Robes, MD  LOS - 0  Outpatient Specialists: None   Chief Complaint  Patient presents with  . Weakness       Brief Narrative  77 year old female with hypertension, hypothyroidism and mild dementia brought to the ED with frequent falls for last few days along with confusion and impaired speech.  Head CT negative for acute findings.  Patient placed on observation for stroke rule out.  EEG suggestive of seizures.   Subjective:   Still quite confused.unable to perform her ADLs.  Removing restless and removing her telemetry leads.   Assessment  & Plan :    Principal Problem:   Acute toxic metabolic encephalopathy Possibly postictal with seizure-like activity on EEG.  Seen by neurology who suggest this is possibly seizure and started on Keppra.  Still quite confused and needing full assistance (at baseline was fully independent with her ADLs)  elevated TSH 6.02, increased Synthroid dose.  Hold her trazodone and Ambien. Severely low B12 (124) which is likely also contributing to her symptoms.  Given a dose of B12 injection on 2/4.   MRI brain/MRA head, 2D echo and carotid Doppler pending.  LDL of 107.  A1c normal.  PT initially recommended outpatient PT but daughter reports that she works third shift and is in no condition to take care of her wanting her to go to SNF given her acute symptoms.  Social work consulted.  Active Problems:    Essential hypertension Resume  Benicar.  Hypothyroidism Elevated TSH (6).  Normal free T3 and T4.  Increase Synthroid dose to 75 mcg.        Code Status : Full code  Family Communication  : Spoke with daughter on the phone  Disposition Plan  : Pending workup, neurology and PT evaluation.  Barriers For Discharge : Active symptoms  Consults  : Neurology  Procedures  : CT head  DVT Prophylaxis  :  Lovenox -   Lab Results  Component Value Date   PLT 166 10/11/2017    Antibiotics  :   Anti-infectives (From admission, onward)   None        Objective:   Vitals:   10/12/17 1527 10/12/17 2227 10/13/17 0230 10/13/17 0630  BP: (!) 157/88 128/73 116/78 (!) 152/84  Pulse: 70 90 98 94  Resp: 20 18 18 16   Temp: 98.4 F (36.9 C) 98.3 F (36.8 C) 98.6 F (37 C)   TempSrc: Oral Oral Oral   SpO2: 99% 99% 98% 100%  Weight:      Height:        Wt Readings from Last 3 Encounters:  10/11/17 82.3 kg (181 lb 7 oz)  10/10/13 95.1 kg (209 lb 9.6 oz)  09/27/13 94 kg (207 lb 3.7 oz)     Intake/Output Summary (Last 24 hours) at 10/13/2017 1306 Last data filed at 10/13/2017 0351 Gross per 24 hour  Intake -  Output 550 ml  Net -550 ml     Physical Exam  Gen: Still restless and confused HEENT:  moist mucosa, supple neck Chest: clear b/l, no added sounds CVS: N S1&S2, no murmurs,  GI: soft, NT, ND, Musculoskeletal: warm, no edema CNS: AAOX0   Data Review:    CBC Recent Labs  Lab 10/11/17 0936 10/11/17 0958  WBC 6.6  --   HGB 10.4* 11.9*  HCT 33.4* 35.0*  PLT 166  --   MCV 72.1*  --   MCH 22.5*  --   MCHC 31.1  --   RDW 14.9  --   LYMPHSABS 1.7  --   MONOABS 0.7  --   EOSABS 0.1  --   BASOSABS 0.0  --     Chemistries  Recent Labs  Lab 10/11/17 0936 10/11/17 0958 10/11/17 1400  NA 138 139  --   K 4.1 4.3  --   CL 102 103  --   CO2 26  --   --   GLUCOSE 98 93  --   BUN 15 14  --   CREATININE 0.97 1.00  --   CALCIUM 9.3  --   --   MG  --   --  1.8  AST 26  --   --   ALT  15  --   --   ALKPHOS 63  --   --   BILITOT 0.7  --   --    ------------------------------------------------------------------------------------------------------------------ Recent Labs    10/12/17 0257  CHOL 180  HDL 58  LDLCALC 107*  TRIG 73  CHOLHDL 3.1    Lab Results  Component Value Date   HGBA1C 5.3 10/12/2017   ------------------------------------------------------------------------------------------------------------------ Recent Labs    10/11/17 0936 10/11/17 1400  TSH  --  6.208*  T3FREE 2.5  --    ------------------------------------------------------------------------------------------------------------------ Recent Labs    10/11/17 1400  VITAMINB12 134*    Coagulation profile Recent Labs  Lab 10/11/17 0936  INR 1.00    No results for input(s): DDIMER in the last 72 hours.  Cardiac Enzymes Recent Labs  Lab 10/11/17 1400 10/11/17 1949 10/12/17 0257  TROPONINI <0.03 <0.03 <0.03   ------------------------------------------------------------------------------------------------------------------ No results found for: BNP  Inpatient Medications  Scheduled Meds: . aspirin  300 mg Rectal Daily   Or  . aspirin  325 mg Oral Daily  . atorvastatin  80 mg Oral q1800  . enoxaparin (LOVENOX) injection  40 mg Subcutaneous Q24H  . levETIRAcetam  250 mg Oral BID  . levothyroxine  25 mcg Intravenous Daily   Continuous Infusions: . sodium chloride 75 mL/hr at 10/13/17 0110   PRN Meds:.acetaminophen **OR** acetaminophen (TYLENOL) oral liquid 160 mg/5 mL **OR** acetaminophen, LORazepam, senna-docusate  Micro Results No results found for this or any previous visit (from the past 240 hour(s)).  Radiology Reports Ct Head Wo Contrast  Result Date: 10/11/2017 CLINICAL DATA:  Altered mental status. Generalized weakness. Multiple recent falls. EXAM: CT HEAD WITHOUT CONTRAST TECHNIQUE: Contiguous axial images were obtained from the base of the skull through  the vertex without intravenous contrast. COMPARISON:  None. FINDINGS: Brain: Diffusely enlarged ventricles and subarachnoid spaces. Patchy white matter low density in both cerebral hemispheres. No intracranial hemorrhage, mass lesion or CT evidence of acute infarction. Vascular: No hyperdense vessel or unexpected calcification. Skull: Normal. Negative for fracture or focal lesion. Sinuses/Orbits: Dysconjugate gaze with both eyes directed medially. Normally pneumatized paranasal sinuses. Other: None. IMPRESSION: 1. No acute abnormality. 2. Moderate diffuse cerebral atrophy and mild diffuse cerebellar atrophy. 3. Moderate chronic small vessel white matter ischemic changes in both cerebral hemispheres. 4. Dysconjugate gaze. Electronically Signed   By: Beckie Salts M.D.   On: 10/11/2017 11:48   Mr Brain Wo Contrast  Result Date: 10/12/2017 CLINICAL DATA:  Stroke follow-up. Unable to ambulate for 3 days. Altered mental status and generalized weakness. EXAM: MRI HEAD WITHOUT CONTRAST MRA HEAD WITHOUT CONTRAST TECHNIQUE: Multiplanar, multiecho pulse sequences of the brain and surrounding structures were obtained without intravenous contrast. Angiographic images of the head were obtained using MRA technique without contrast. COMPARISON:  Head CT from yesterday FINDINGS: MRI HEAD FINDINGS Severely motion degraded and partial study with no coronal T2, FLAIR, or axial T1 weighted imaging. Severe brain atrophy, with sulcal and temporal horn widening greater on the left. Reasonably diagnostic diffusion that is negative for infarct. No hydrocephalus, shift, or evidence of extra-axial collection. MRA HEAD FINDINGS Severely motion degraded to the degree that only patency of bilateral carotid, vertebral, basilar arteries is possible. Apparent multifocal high-grade stenosis is not a reliable finding given the degree of artifact. IMPRESSION: 1. Partial and severely motion degraded exam. MRA was nondiagnostic. 2. Diffusion imaging  is diagnostic and negative for infarct. 3. Severe atrophy in this patient with dementia. There is asymmetric volume loss on the left as seen with semantic dementia Electronically Signed   By: Marnee Spring M.D.   On: 10/12/2017 12:12   US Carotid Bilateral (at [facility] Only)  Result Date: 10/12/2017 CLINICAL DATA:  77 year old female with a history of stroke. Cardiovascular risk factors include hypertension, known prior stroke/TIA EXAM: BILATERAL CAROTID DUPLEX ULTRASOUND TECHNIQUE: Wallace Cullens scale imaging, color Doppler and duplex ultrasound were performed of bilateral carotid and vertebral arteries in the neck. COMPARISON:  No prior duplex FINDINGS: Criteria: Quantification of carotid stenosis is based on velocity parameters that correlate the residual internal carotid diameter with NASCET-based stenosis levels, using the diameter of the distal internal carotid lumen as the denominator for stenosis measurement. The following velocity measurements were obtained: RIGHT ICA:  Systolic 52 cm/sec, Diastolic 17 cm/sec CCA:  88 cm/sec SYSTOLIC ICA/CCA RATIO:  0.6 ECA:  30 cm/sec LEFT ICA:  Systolic 52 cm/sec, Diastolic 18 cm/sec CCA:  50 cm/sec SYSTOLIC ICA/CCA RATIO:  81.0 ECA:  30 cm/sec Right Brachial SBP: Not acquired Left Brachial SBP: Not acquired RIGHT CAROTID ARTERY: Unremarkable appearance of the carotid system without significant atherosclerotic disease. Intermediate waveform of the CCA. Low resistance waveform of the ICA.  Tortuosity of the carotid system. RIGHT VERTEBRAL ARTERY: Antegrade flow with low resistance waveform. LEFT CAROTID ARTERY: Unremarkable appearance of the carotid system without significant atherosclerotic disease. Intermediate waveform of the CCA. Low resistance waveform of the ICA. Tortuosity of the carotid system. LEFT VERTEBRAL ARTERY:  Antegrade flow with low resistance waveform. IMPRESSION: Color duplex shows no significant plaque, with no hemodynamically significant stenosis by  duplex criteria in the extracranial cerebrovascular circulation. Signed, Yvone NeuJaime S. Loreta AveWagner, DO [facility] Electronically Signed   By: Gilmer MorJaime  Wagner D.O.   On: 10/12/2017 12:57   Mr Maxine GlennMra Head/brain MVWo Cm  Result Date: 10/12/2017 CLINICAL DATA:  Stroke follow-up. Unable to ambulate for 3 days. Altered mental status and generalized weakness. EXAM: MRI HEAD WITHOUT CONTRAST MRA HEAD WITHOUT CONTRAST TECHNIQUE: Multiplanar, multiecho pulse sequences of the brain and surrounding structures were obtained without intravenous contrast. Angiographic images of the head were obtained using MRA technique without contrast. COMPARISON:  Head CT from yesterday FINDINGS: MRI HEAD FINDINGS Severely motion degraded and partial study with no coronal T2, FLAIR, or axial T1 weighted imaging. Severe brain atrophy, with sulcal and temporal horn widening greater on the left. Reasonably diagnostic diffusion that is negative for infarct. No hydrocephalus, shift, or evidence of extra-axial collection. MRA HEAD FINDINGS Severely motion degraded to the degree that only patency of bilateral carotid, vertebral, basilar arteries is possible. Apparent multifocal high-grade stenosis is not a reliable finding given the degree of artifact. IMPRESSION: 1. Partial and severely motion degraded exam. MRA was nondiagnostic. 2. Diffusion imaging is diagnostic and negative for infarct. 3. Severe atrophy in this patient with dementia. There is asymmetric volume loss on the left as seen with semantic dementia Electronically Signed   By: Marnee SpringJonathon  Watts M.D.   On: 10/12/2017 12:12    Time Spent in minutes  25   Ellen Goris M.D on 10/13/2017 at 1:06 PM  Between 7am to 7pm - Pager - [phone]  After 7pm go to [email or web address] - password [email or web address]  [facility] -  Office  [phone]

## 2017-10-14 DIAGNOSIS — W19XXXA Unspecified fall, initial encounter: Secondary | ICD-10-CM

## 2017-10-14 DIAGNOSIS — Y92009 Unspecified place in unspecified non-institutional (private) residence as the place of occurrence of the external cause: Secondary | ICD-10-CM

## 2017-10-14 DIAGNOSIS — E039 Hypothyroidism, unspecified: Secondary | ICD-10-CM

## 2017-10-14 DIAGNOSIS — R4182 Altered mental status, unspecified: Secondary | ICD-10-CM

## 2017-10-14 DIAGNOSIS — I10 Essential (primary) hypertension: Secondary | ICD-10-CM

## 2017-10-14 DIAGNOSIS — G9340 Encephalopathy, unspecified: Secondary | ICD-10-CM

## 2017-10-14 DIAGNOSIS — R569 Unspecified convulsions: Principal | ICD-10-CM

## 2017-10-14 MED ORDER — LEVOTHYROXINE SODIUM 75 MCG PO TABS: 75.0000 ug | ORAL_TABLET | Freq: Every day | ORAL | 1 refills | Status: AC

## 2017-10-14 MED ORDER — LEVETIRACETAM 250 MG PO TABS: 250.0000 mg | ORAL_TABLET | Freq: Two times a day (BID) | ORAL | 1 refills | Status: AC

## 2017-10-14 MED ORDER — ASPIRIN 325 MG PO TABS: 325.0000 mg | ORAL_TABLET | Freq: Every day | ORAL | 1 refills | Status: AC

## 2017-10-14 MED ORDER — VITAMIN B-12 1000 MCG PO TABS: 1000.0000 ug | ORAL_TABLET | Freq: Two times a day (BID) | ORAL | 3 refills | Status: AC

## 2017-10-14 NOTE — Clinical Social Work Note (Signed)
Late entry for 10/13/17:  LCSW met with patient and daughter. LCSW discussed placement options. LCSW contacted three facilities in [place], [place] to inquire if they would accept patient with Medicaid Pending as daughter applied for Medicaid on 10/12/17 for patient. No facilities were able to take patient Medicaid pending.   Daughter stated that she would take patient home and have patient's son to come to the home to be with her as she works third shif.t  LCSW signing off.     Latoya Diskin, Clydene Pugh, LCSW

## 2017-10-14 NOTE — Care Management Important Message (Signed)
Important Message  Patient Details  Name: Gabrielle Brown MRN: [id number] Date of Birth: Mar 08, 1941   Medicare Important Message Given:  Yes    Ashleynicole Mcclees, Chrystine OilerSharley Diane, RN 10/14/2017, 3:47 PM

## 2017-10-14 NOTE — Progress Notes (Signed)
Patient refused to swallow her pills this morning. Will try again when daughter gets here.

## 2017-10-14 NOTE — Progress Notes (Signed)
Patient discharged with personal belongings. No IVs were present to remove. Patient discharged with all printed prescriptions and AVS.

## 2017-10-14 NOTE — Discharge Summary (Signed)
Physician Discharge Summary  Gabrielle Brown WUJ:[id number] DOB: 29-Mar-1941 DOA: 10/11/2017  PCP: Arlina Robes, MD  Admit date: 10/11/2017 Discharge date: 10/14/2017  Time spent: 35 minutes  Recommendations for Outpatient Follow-up:  1. Close follow-up to patient B12 level and further repletion as needed 2. Reassess thyroid panel function in 4-6 weeks and further adjust Synthroid as needed. 3. Repeat basic metabolic panel to check electrolytes and renal function 4. Reassess blood pressure and further adjust antihypertensive regimen as needed. 5. Follow response of medication and concern of any further active seizures.  Patient to follow-up with neurology for this matter as well.   Discharge Diagnoses:  Principal Problem:   Seizures (HCC) Active Problems:   Acute encephalopathy   CVA (cerebral vascular accident) (HCC)   Essential hypertension   Fall at home, initial encounter   Hypothyroidism   B12 deficiency anemia   Altered mental status   Discharge Condition: Stable and improved.  Patient discharged home with family care and instruction to follow-up with PCP and neurology as an outpatient.  Diet recommendation: Heart healthy diet.  Filed Weights   10/11/17 0923 10/11/17 1433  Weight: 93 kg (205 lb) 82.3 kg (181 lb 7 oz)    History of present illness:  As per H&P dictated by Dr. Gonzella Lex on 10/11/17 77 y.o. female, with history of hypertension, hypothyroidism and mild dementia who lives with her daughter was brought to the ED for recurrent falls in the past 3 days.  On 1/31 patient was found on the floor by her daughter after she returned from work she was complaining of pain in her back and daughter also noted some facial droop so she took her to [facility] where patient had a head CT along with blood work done which was negative and was discharged home.  Daughter found that since then patient was weak with impaired speech with confusion. Again last night she had a  fall in her room which was unwitnessed.  Daughter went to see her and she was alert and awake but very confused and talking incoherently, unable to recognize her.  She again noticed some facial droop but no other change.  Patient has chronic medial conjugate gaze.  Daughter denies any recent plane of headaches, blurred vision, dizziness, nausea, vomiting, chest pain, palpitations, fevers, chills, abdominal pain, dysuria, diarrhea.  Daughter denied noticing any seizure-like activity, bowel or urinary incontinence. At baseline patient per daughter has mild memory deficit but is usually quite coherent, well capable of her ADLs and ambulates without assistance.  Denies any new medications, recent travel or recent illness.    Hospital Course:  Acute metabolic encephalopathy: Appears to be secondary to seizure and post ictal seizure.  Patient low vitamin B12 also contributing. -Patient workup has demonstrated concern for active seizure activity on her EEG -Follow neurology recommendation she was loaded with Keppra and discharged on Keppra 250 mg twice a day -Patient instructed to follow-up with neurology as an outpatient in the next 4-6 weeks. -Physical therapy has seen the patient and recommend an outpatient PT. -Patient will be discharged home with family care (discussed with daughter and son at bedside).  B12 deficiency anemia -Patient receive B12 injection while hospitalized -Discharged on high dose 1000 mcg by mouth twice a day -Will require close outpatient follow-up and if needed performed repletion by injections.  Essential HTN -continue Benicar -advise to follow heart healthy diet   Hypothyroidism -TSH was elevated -Synthroid dose adjusted for better control -Will recommend thyroid panel in 4-6  weeks to further assess the response to adjusted dose of Synthroid.  Hyperlipidemia -Continue statins  Mild dementia -Per neurology for further assessment as an outpatient and initiation of  Namenda/Aricept to be decided -Patient requires assistance and supervision with her activities of daily living.  Procedures:  See below for x-ray reports  EEG: suggesting Seizure activity   Carotid duplex: No significant plaque formation, no hemodynamically significant stenosis by duplex criteria in the extracranial cerebrovascular circulation.  Consultations:  Neurology   Discharge Exam: Vitals:   10/14/17 1030 10/14/17 1500  BP: 112/77 115/68  Pulse: 90 89  Resp: (!) 98 18  Temp: 98.5 F (36.9 C) 98.6 F (37 C)  SpO2: 100% 100%    General: Patient with poor insight, oriented x1, afebrile, in no major distress; denies chest pain and shortness of breath. Cardiovascular: S1 and S2, no rubs, no gallops, no murmurs. Respiratory: Clear to auscultation bilaterally Abdomen: Soft, nontender, nondistended, positive bowel sounds Lower extremities: No edema, no cyanosis, no clubbing Neurologic exam: Alert, awake and oriented x1, able to follow some simple commands intermittently, still with poor insight.  No focal motor deficit on exam.  Discharge Instructions   Discharge Instructions    Ambulatory referral to Physical Therapy   Complete by:  As directed    Evaluation and treatment for outpatient physical rehabilitation.   Diet - low sodium heart healthy   Complete by:  As directed    Discharge instructions   Complete by:  As directed    Maintain adequate hydration Follow heart healthy diet Arrange follow-up with PCP in 10 days Outpatient physical therapy has been recommended. Arrange follow-up with neurology in the next 4-6 weeks.     Allergies as of 10/14/2017   No Known Allergies     Medication List    STOP taking these medications   zolpidem 10 MG tablet Commonly known as:  AMBIEN     TAKE these medications   aspirin 325 MG tablet Take 1 tablet (325 mg total) by mouth daily. Start taking on:  10/15/2017   atenolol 25 MG tablet Commonly known as:   TENORMIN Take 25 mg by mouth 2 (two) times daily.   BENICAR HCT 20-12.5 MG tablet Generic drug:  olmesartan-hydrochlorothiazide Take 1 tablet by mouth every morning.   levETIRAcetam 250 MG tablet Commonly known as:  KEPPRA Take 1 tablet (250 mg total) by mouth 2 (two) times daily.   levothyroxine 75 MCG tablet Commonly known as:  SYNTHROID, LEVOTHROID Take 1 tablet (75 mcg total) by mouth daily before breakfast. What changed:    medication strength  how much to take   traZODone 100 MG tablet Commonly known as:  DESYREL Take 100 mg by mouth at bedtime.      No Known Allergies Follow-up Information    Arlina Robes, MD. Schedule an appointment as soon as possible for a visit in 10 day(s).   Specialty:  Family Medicine Contact information: [address] [place] [address] [phone]          The results of significant diagnostics from this hospitalization (including imaging, microbiology, ancillary and laboratory) are listed below for reference.    Significant Diagnostic Studies: Ct Head Wo Contrast  Result Date: 10/11/2017 CLINICAL DATA:  Altered mental status. Generalized weakness. Multiple recent falls. EXAM: CT HEAD WITHOUT CONTRAST TECHNIQUE: Contiguous axial images were obtained from the base of the skull through the vertex without intravenous contrast. COMPARISON:  None. FINDINGS: Brain: Diffusely enlarged ventricles and subarachnoid spaces. Patchy  white matter low density in both cerebral hemispheres. No intracranial hemorrhage, mass lesion or CT evidence of acute infarction. Vascular: No hyperdense vessel or unexpected calcification. Skull: Normal. Negative for fracture or focal lesion. Sinuses/Orbits: Dysconjugate gaze with both eyes directed medially. Normally pneumatized paranasal sinuses. Other: None. IMPRESSION: 1. No acute abnormality. 2. Moderate diffuse cerebral atrophy and mild diffuse cerebellar atrophy. 3. Moderate chronic small vessel white  matter ischemic changes in both cerebral hemispheres. 4. Dysconjugate gaze. Electronically Signed   By: Beckie Salts M.D.   On: 10/11/2017 11:48   Mr Brain Wo Contrast  Result Date: 10/12/2017 CLINICAL DATA:  Stroke follow-up. Unable to ambulate for 3 days. Altered mental status and generalized weakness. EXAM: MRI HEAD WITHOUT CONTRAST MRA HEAD WITHOUT CONTRAST TECHNIQUE: Multiplanar, multiecho pulse sequences of the brain and surrounding structures were obtained without intravenous contrast. Angiographic images of the head were obtained using MRA technique without contrast. COMPARISON:  Head CT from yesterday FINDINGS: MRI HEAD FINDINGS Severely motion degraded and partial study with no coronal T2, FLAIR, or axial T1 weighted imaging. Severe brain atrophy, with sulcal and temporal horn widening greater on the left. Reasonably diagnostic diffusion that is negative for infarct. No hydrocephalus, shift, or evidence of extra-axial collection. MRA HEAD FINDINGS Severely motion degraded to the degree that only patency of bilateral carotid, vertebral, basilar arteries is possible. Apparent multifocal high-grade stenosis is not a reliable finding given the degree of artifact. IMPRESSION: 1. Partial and severely motion degraded exam. MRA was nondiagnostic. 2. Diffusion imaging is diagnostic and negative for infarct. 3. Severe atrophy in this patient with dementia. There is asymmetric volume loss on the left as seen with semantic dementia Electronically Signed   By: Marnee Spring M.D.   On: 10/12/2017 12:12   US Carotid Bilateral (at [facility] Only)  Result Date: 10/12/2017 CLINICAL DATA:  77 year old female with a history of stroke. Cardiovascular risk factors include hypertension, known prior stroke/TIA EXAM: BILATERAL CAROTID DUPLEX ULTRASOUND TECHNIQUE: Wallace Cullens scale imaging, color Doppler and duplex ultrasound were performed of bilateral carotid and vertebral arteries in the neck. COMPARISON:  No prior duplex  FINDINGS: Criteria: Quantification of carotid stenosis is based on velocity parameters that correlate the residual internal carotid diameter with NASCET-based stenosis levels, using the diameter of the distal internal carotid lumen as the denominator for stenosis measurement. The following velocity measurements were obtained: RIGHT ICA:  Systolic 52 cm/sec, Diastolic 17 cm/sec CCA:  88 cm/sec SYSTOLIC ICA/CCA RATIO:  0.6 ECA:  30 cm/sec LEFT ICA:  Systolic 52 cm/sec, Diastolic 18 cm/sec CCA:  50 cm/sec SYSTOLIC ICA/CCA RATIO:  81.0 ECA:  30 cm/sec Right Brachial SBP: Not acquired Left Brachial SBP: Not acquired RIGHT CAROTID ARTERY: Unremarkable appearance of the carotid system without significant atherosclerotic disease. Intermediate waveform of the CCA. Low resistance waveform of the ICA. Tortuosity of the carotid system. RIGHT VERTEBRAL ARTERY: Antegrade flow with low resistance waveform. LEFT CAROTID ARTERY: Unremarkable appearance of the carotid system without significant atherosclerotic disease. Intermediate waveform of the CCA. Low resistance waveform of the ICA. Tortuosity of the carotid system. LEFT VERTEBRAL ARTERY:  Antegrade flow with low resistance waveform. IMPRESSION: Color duplex shows no significant plaque, with no hemodynamically significant stenosis by duplex criteria in the extracranial cerebrovascular circulation. Signed, Yvone Neu. Loreta Ave, DO [facility] Electronically Signed   By: Gilmer Mor D.O.   On: 10/12/2017 12:57   Mr Maxine Glenn Head/brain ZO Cm  Result Date: 10/12/2017 CLINICAL DATA:  Stroke  follow-up. Unable to ambulate for 3 days. Altered mental status and generalized weakness. EXAM: MRI HEAD WITHOUT CONTRAST MRA HEAD WITHOUT CONTRAST TECHNIQUE: Multiplanar, multiecho pulse sequences of the brain and surrounding structures were obtained without intravenous contrast. Angiographic images of the head were obtained using MRA  technique without contrast. COMPARISON:  Head CT from yesterday FINDINGS: MRI HEAD FINDINGS Severely motion degraded and partial study with no coronal T2, FLAIR, or axial T1 weighted imaging. Severe brain atrophy, with sulcal and temporal horn widening greater on the left. Reasonably diagnostic diffusion that is negative for infarct. No hydrocephalus, shift, or evidence of extra-axial collection. MRA HEAD FINDINGS Severely motion degraded to the degree that only patency of bilateral carotid, vertebral, basilar arteries is possible. Apparent multifocal high-grade stenosis is not a reliable finding given the degree of artifact. IMPRESSION: 1. Partial and severely motion degraded exam. MRA was nondiagnostic. 2. Diffusion imaging is diagnostic and negative for infarct. 3. Severe atrophy in this patient with dementia. There is asymmetric volume loss on the left as seen with semantic dementia Electronically Signed   By: Marnee SpringJonathon  Watts M.D.   On: 10/12/2017 12:12    Microbiology: No results found for this or any previous visit (from the past 240 hour(s)).   Labs: Basic Metabolic Panel: Recent Labs  Lab 10/11/17 0936 10/11/17 0958 10/11/17 1400  NA 138 139  --   K 4.1 4.3  --   CL 102 103  --   CO2 26  --   --   GLUCOSE 98 93  --   BUN 15 14  --   CREATININE 0.97 1.00  --   CALCIUM 9.3  --   --   MG  --   --  1.8   Liver Function Tests: Recent Labs  Lab 10/11/17 0936  AST 26  ALT 15  ALKPHOS 63  BILITOT 0.7  PROT 6.8  ALBUMIN 3.6    Recent Labs  Lab 10/11/17 1400  AMMONIA 11   CBC: Recent Labs  Lab 10/11/17 0936 10/11/17 0958  WBC 6.6  --   NEUTROABS 4.0  --   HGB 10.4* 11.9*  HCT 33.4* 35.0*  MCV 72.1*  --   PLT 166  --    Cardiac Enzymes: Recent Labs  Lab 10/11/17 1400 10/11/17 1949 10/12/17 0257  TROPONINI <0.03 <0.03 <0.03    Signed:  Vassie Lollarlos Grayland Daisey MD.  [facility] 10/14/2017, 3:27 PM
